# Patient Record
Sex: Female | Born: 1935 | Race: White | Hispanic: No
Health system: Southern US, Community
[De-identification: ages and names within clinical notes are randomized; demographics above are authoritative.]

---

## 2004-04-15 ENCOUNTER — Inpatient Hospital Stay (HOSPITAL_COMMUNITY): Admission: RE | Admit: 2004-04-15 | Discharge: 2004-04-16 | Payer: Self-pay | Admitting: Neurological Surgery

## 2004-04-18 ENCOUNTER — Emergency Department: Payer: Self-pay | Admitting: Emergency Medicine

## 2005-01-19 ENCOUNTER — Ambulatory Visit: Payer: Self-pay | Admitting: Family Medicine

## 2005-06-14 ENCOUNTER — Ambulatory Visit: Payer: Self-pay | Admitting: Rheumatology

## 2006-01-25 ENCOUNTER — Ambulatory Visit: Payer: Self-pay | Admitting: Family Medicine

## 2006-02-01 ENCOUNTER — Other Ambulatory Visit: Payer: Self-pay

## 2006-02-01 ENCOUNTER — Emergency Department: Payer: Self-pay

## 2006-03-28 ENCOUNTER — Ambulatory Visit: Payer: Self-pay | Admitting: General Surgery

## 2006-08-04 ENCOUNTER — Emergency Department: Payer: Self-pay | Admitting: Emergency Medicine

## 2006-08-04 ENCOUNTER — Other Ambulatory Visit: Payer: Self-pay

## 2006-12-13 ENCOUNTER — Ambulatory Visit: Payer: Self-pay | Admitting: Family Medicine

## 2007-01-30 ENCOUNTER — Ambulatory Visit: Payer: Self-pay | Admitting: Family Medicine

## 2007-01-31 ENCOUNTER — Other Ambulatory Visit: Payer: Self-pay

## 2007-01-31 ENCOUNTER — Inpatient Hospital Stay: Payer: Self-pay | Admitting: Internal Medicine

## 2007-02-01 ENCOUNTER — Other Ambulatory Visit: Payer: Self-pay

## 2008-01-30 ENCOUNTER — Encounter: Payer: Self-pay | Admitting: Family Medicine

## 2008-01-31 ENCOUNTER — Ambulatory Visit: Payer: Self-pay | Admitting: Family Medicine

## 2008-02-12 ENCOUNTER — Encounter: Payer: Self-pay | Admitting: Family Medicine

## 2008-03-13 ENCOUNTER — Encounter: Payer: Self-pay | Admitting: Family Medicine

## 2008-04-13 ENCOUNTER — Encounter: Payer: Self-pay | Admitting: Family Medicine

## 2008-04-18 ENCOUNTER — Emergency Department: Payer: Self-pay | Admitting: Emergency Medicine

## 2008-11-08 ENCOUNTER — Ambulatory Visit: Payer: Self-pay | Admitting: Neurology

## 2009-02-03 ENCOUNTER — Ambulatory Visit: Payer: Self-pay | Admitting: Family Medicine

## 2009-06-25 ENCOUNTER — Ambulatory Visit: Payer: Self-pay | Admitting: Family Medicine

## 2009-07-20 ENCOUNTER — Ambulatory Visit: Payer: Self-pay | Admitting: Family Medicine

## 2009-07-29 ENCOUNTER — Ambulatory Visit: Payer: Self-pay | Admitting: Family Medicine

## 2009-08-08 ENCOUNTER — Inpatient Hospital Stay: Payer: Self-pay | Admitting: Internal Medicine

## 2009-09-14 ENCOUNTER — Encounter: Admission: RE | Admit: 2009-09-14 | Discharge: 2009-09-14 | Payer: Self-pay | Admitting: Neurological Surgery

## 2009-10-06 ENCOUNTER — Encounter: Admission: RE | Admit: 2009-10-06 | Discharge: 2009-10-06 | Payer: Self-pay | Admitting: Neurosurgery

## 2009-11-03 ENCOUNTER — Encounter: Admission: RE | Admit: 2009-11-03 | Discharge: 2009-11-03 | Payer: Self-pay | Admitting: Neurological Surgery

## 2009-12-18 ENCOUNTER — Ambulatory Visit: Payer: Self-pay | Admitting: Neurological Surgery

## 2009-12-24 ENCOUNTER — Ambulatory Visit: Payer: Self-pay | Admitting: Neurological Surgery

## 2010-03-10 ENCOUNTER — Ambulatory Visit: Payer: Self-pay | Admitting: Family Medicine

## 2010-10-29 NOTE — Op Note (Signed)
NAMEANZLEY, DIBBERN NO.:  [id number]   MEDICAL RECORD NO.:  [id number]          PATIENT TYPE:  INP   LOCATION:  2899                         FACILITY:  [facility]   PHYSICIAN:  Tia Alert, MD     DATE OF BIRTH:  [birth year]   DATE OF PROCEDURE:  04/15/2004  DATE OF DISCHARGE:                                 OPERATIVE REPORT   REFERRING PHYSICIAN:  Valinda Hoar, M.D.   PREOPERATIVE DIAGNOSIS:  Lumbar spinal stenosis L2-3, L3-4.   POSTOPERATIVE DIAGNOSIS:  Lumbar spinal stenosis L2-3, L3-4.   PROCEDURE:  Decompressive lumbar hemilaminectomy, medial facetectomy and  foraminotomies L2-3 and L3-4 with sublaminar decompression for central canal  and right lateral recess decompression.   SURGEON:  Tia Alert, M.D.   ANESTHESIA:  General endotracheal anesthesia.   COMPLICATIONS:  None apparent.   INDICATIONS FOR PROCEDURE:  Ms. [name] Stanton is a 77 year old white female who  presented to the neurosurgery clinic with complaints of left greater than  right leg pain especially with ambulation.  She was walking in a flexed  posture.  She had an MRI which showed a severe spinal stenosis at L2-3 and  L3-4.  Recommended a lumbar decompression.  She understood the risks,  benefits, and alternatives and wished to proceed.   DESCRIPTION OF PROCEDURE:  The patient was taken to the operating room and  after induction of adequate general endotracheal anesthesia, her lumbar  region was prepped with DuraPrep and then draped in the usual sterile  fashion.  There were 10 mL of local anesthesia injected and then a dorsal  midline incision was made and carried down to the lumbosacral fascia.  The  fascia was opened and the paraspinous musculature was taken down in the  subperiosteal fashion to expose L2-3 and L3-4 on the left side.  Intraoperative x-ray confirmed my level and then using combination of the  high speed air powered __________ drill and the Kerrison punches, a  hemilaminectomy, medial facetectomy and foraminotomy was performed at L2-3  and L3-4 on the left side.  The ligament was opened and removed to expose  the underlying dura.  I was able to use the drill to drill up under the  spinous process and then perform sublaminar decompressions at each level for  central canal and right lateral recess decompression.  I was able to palpate  the pedicles on each side at both levels.  Once the decompression was  complete, I irrigated with copious amounts of Bacitracin containing saline  solution, dried all bleeding ports with bipolar cautery and inspected the  nerve roots once again to assure adequate decompression and then layered the  dura with Gelfoam and closed the fascia with interrupted #1 Vicryl, closed  subcutaneous and subcuticular tissue with 2-0 and 3-0 Vicryl and closed the  skin with Dermabond.  The drapes were removed.  The patient was awakened  from general anesthesia and transferred to the recovery room in stable  condition.  Sponge, needle and instrument counts were correct at the end of  the procedure.       DSJ/MEDQ  D:  04/15/2004  T:  04/15/2004  Job:  [id number]   cc:   Deeann Saint, M.D.

## 2010-12-29 ENCOUNTER — Ambulatory Visit: Payer: Self-pay | Admitting: Pain Medicine

## 2010-12-30 ENCOUNTER — Other Ambulatory Visit: Payer: Self-pay | Admitting: Pain Medicine

## 2011-01-06 ENCOUNTER — Encounter: Payer: Self-pay | Admitting: Pain Medicine

## 2011-01-12 ENCOUNTER — Encounter: Payer: Self-pay | Admitting: Pain Medicine

## 2011-01-26 ENCOUNTER — Ambulatory Visit: Payer: Self-pay | Admitting: Family Medicine

## 2011-03-06 ENCOUNTER — Ambulatory Visit: Payer: Self-pay | Admitting: Surgery

## 2011-03-09 ENCOUNTER — Ambulatory Visit: Payer: Self-pay | Admitting: Pain Medicine

## 2011-03-17 ENCOUNTER — Ambulatory Visit: Payer: Self-pay | Admitting: Unknown Physician Specialty

## 2011-04-06 ENCOUNTER — Ambulatory Visit: Payer: Self-pay | Admitting: Unknown Physician Specialty

## 2012-01-12 ENCOUNTER — Ambulatory Visit: Payer: Self-pay | Admitting: Family Medicine

## 2012-08-22 ENCOUNTER — Ambulatory Visit: Payer: Self-pay | Admitting: Family Medicine

## 2013-01-21 ENCOUNTER — Ambulatory Visit: Payer: Self-pay | Admitting: Family Medicine

## 2013-04-19 ENCOUNTER — Emergency Department: Payer: Self-pay | Admitting: Internal Medicine

## 2013-04-19 LAB — URINALYSIS, COMPLETE
Bilirubin,UR: NEGATIVE
Glucose,UR: NEGATIVE mg/dL (ref 0–75)
Hyaline Cast: 2
Ketone: NEGATIVE
Nitrite: POSITIVE
Ph: 6 (ref 4.5–8.0)
Protein: 30
RBC,UR: 51 /HPF (ref 0–5)
Specific Gravity: 1.01 (ref 1.003–1.030)
Squamous Epithelial: 1
WBC UR: 177 /HPF (ref 0–5)

## 2013-04-19 LAB — COMPREHENSIVE METABOLIC PANEL
Albumin: 3.4 g/dL (ref 3.4–5.0)
Alkaline Phosphatase: 100 U/L (ref 50–136)
Anion Gap: 5 — ABNORMAL LOW (ref 7–16)
BUN: 12 mg/dL (ref 7–18)
Bilirubin,Total: 1.6 mg/dL — ABNORMAL HIGH (ref 0.2–1.0)
Calcium, Total: 8.8 mg/dL (ref 8.5–10.1)
Chloride: 107 mmol/L (ref 98–107)
Co2: 27 mmol/L (ref 21–32)
Creatinine: 0.76 mg/dL (ref 0.60–1.30)
EGFR (African American): >60
EGFR (Non-African Amer.): >60
Glucose: 99 mg/dL (ref 65–99)
Osmolality: 277 (ref 275–301)
Potassium: 4 mmol/L (ref 3.5–5.1)
SGOT(AST): 30 U/L (ref 15–37)
SGPT (ALT): 22 U/L (ref 12–78)
Sodium: 139 mmol/L (ref 136–145)
Total Protein: 7.1 g/dL (ref 6.4–8.2)

## 2013-04-19 LAB — CBC WITH DIFFERENTIAL/PLATELET
Basophil #: 0.1 10*3/uL (ref 0.0–0.1)
Basophil %: 1 %
Eosinophil #: 0.4 10*3/uL (ref 0.0–0.7)
Eosinophil %: 5.6 %
HCT: 36.5 % (ref 35.0–47.0)
HGB: 12.8 g/dL (ref 12.0–16.0)
Lymphocyte #: 1.3 10*3/uL (ref 1.0–3.6)
Lymphocyte %: 19.2 %
MCH: 32.8 pg (ref 26.0–34.0)
MCHC: 35.2 g/dL (ref 32.0–36.0)
MCV: 93 fL (ref 80–100)
Monocyte #: 0.5 x10 3/mm (ref 0.2–0.9)
Monocyte %: 7.4 %
Neutrophil #: 4.4 10*3/uL (ref 1.4–6.5)
Neutrophil %: 66.8 %
Platelet: 117 10*3/uL — ABNORMAL LOW (ref 150–440)
RBC: 3.92 10*6/uL (ref 3.80–5.20)
RDW: 14.3 % (ref 11.5–14.5)
WBC: 6.6 10*3/uL (ref 3.6–11.0)

## 2013-04-19 LAB — PROTIME-INR
INR: 1.8
Prothrombin Time: 20.3 secs — ABNORMAL HIGH (ref 11.5–14.7)

## 2013-05-31 ENCOUNTER — Emergency Department: Payer: Self-pay | Admitting: Emergency Medicine

## 2013-05-31 LAB — PROTIME-INR
INR: 6
Prothrombin Time: 50.7 secs — ABNORMAL HIGH (ref 11.5–14.7)

## 2013-06-12 ENCOUNTER — Emergency Department: Payer: Self-pay | Admitting: Emergency Medicine

## 2013-06-12 LAB — COMPREHENSIVE METABOLIC PANEL
Albumin: 3 g/dL — ABNORMAL LOW (ref 3.4–5.0)
Alkaline Phosphatase: 93 U/L
Anion Gap: 3 — ABNORMAL LOW (ref 7–16)
BUN: 14 mg/dL (ref 7–18)
Bilirubin,Total: 1.2 mg/dL — ABNORMAL HIGH (ref 0.2–1.0)
Calcium, Total: 8.7 mg/dL (ref 8.5–10.1)
Chloride: 101 mmol/L (ref 98–107)
Co2: 31 mmol/L (ref 21–32)
Creatinine: 0.86 mg/dL (ref 0.60–1.30)
EGFR (African American): >60
EGFR (Non-African Amer.): >60
Glucose: 110 mg/dL — ABNORMAL HIGH (ref 65–99)
Osmolality: 271 (ref 275–301)
Potassium: 3.9 mmol/L (ref 3.5–5.1)
SGOT(AST): 36 U/L (ref 15–37)
SGPT (ALT): 9 U/L — ABNORMAL LOW (ref 12–78)
Sodium: 135 mmol/L — ABNORMAL LOW (ref 136–145)
Total Protein: 6.9 g/dL (ref 6.4–8.2)

## 2013-06-12 LAB — URINALYSIS, COMPLETE
Bilirubin,UR: NEGATIVE
Blood: NEGATIVE
Glucose,UR: NEGATIVE mg/dL (ref 0–75)
Ketone: NEGATIVE
Leukocyte Esterase: NEGATIVE
Nitrite: NEGATIVE
Ph: 7 (ref 4.5–8.0)
Protein: NEGATIVE
RBC,UR: 1 /HPF (ref 0–5)
Specific Gravity: 1.008 (ref 1.003–1.030)
Squamous Epithelial: <1
WBC UR: <1 /HPF (ref 0–5)

## 2013-06-12 LAB — TROPONIN I
Troponin-I: <0.02 ng/mL
Troponin-I: <0.02 ng/mL

## 2013-06-12 LAB — PROTIME-INR
INR: 1.6
Prothrombin Time: 18.8 secs — ABNORMAL HIGH (ref 11.5–14.7)

## 2013-06-12 LAB — CBC
HCT: 35.7 % (ref 35.0–47.0)
HGB: 12.3 g/dL (ref 12.0–16.0)
MCH: 32.9 pg (ref 26.0–34.0)
MCHC: 34.4 g/dL (ref 32.0–36.0)
MCV: 96 fL (ref 80–100)
Platelet: 124 10*3/uL — ABNORMAL LOW (ref 150–440)
RBC: 3.74 10*6/uL — ABNORMAL LOW (ref 3.80–5.20)
RDW: 15 % — ABNORMAL HIGH (ref 11.5–14.5)
WBC: 4.8 10*3/uL (ref 3.6–11.0)

## 2013-06-25 ENCOUNTER — Emergency Department: Payer: Self-pay | Admitting: Emergency Medicine

## 2013-06-25 LAB — CBC WITH DIFFERENTIAL/PLATELET
Basophil #: 0.1 10*3/uL (ref 0.0–0.1)
Basophil %: 1.1 %
Eosinophil #: 0.3 10*3/uL (ref 0.0–0.7)
Eosinophil %: 4.1 %
HCT: 38.7 % (ref 35.0–47.0)
HGB: 13.2 g/dL (ref 12.0–16.0)
Lymphocyte #: 1.4 10*3/uL (ref 1.0–3.6)
Lymphocyte %: 23 %
MCH: 32.6 pg (ref 26.0–34.0)
MCHC: 34.2 g/dL (ref 32.0–36.0)
MCV: 95 fL (ref 80–100)
Monocyte #: 0.6 x10 3/mm (ref 0.2–0.9)
Monocyte %: 9.7 %
Neutrophil #: 3.8 10*3/uL (ref 1.4–6.5)
Neutrophil %: 62.1 %
Platelet: 161 10*3/uL (ref 150–440)
RBC: 4.06 10*6/uL (ref 3.80–5.20)
RDW: 15 % — ABNORMAL HIGH (ref 11.5–14.5)
WBC: 6.1 10*3/uL (ref 3.6–11.0)

## 2013-06-25 LAB — COMPREHENSIVE METABOLIC PANEL
Albumin: 3.4 g/dL (ref 3.4–5.0)
Alkaline Phosphatase: 114 U/L
Anion Gap: 4 — ABNORMAL LOW (ref 7–16)
BUN: 26 mg/dL — ABNORMAL HIGH (ref 7–18)
Bilirubin,Total: 0.8 mg/dL (ref 0.2–1.0)
Calcium, Total: 9.2 mg/dL (ref 8.5–10.1)
Chloride: 102 mmol/L (ref 98–107)
Co2: 32 mmol/L (ref 21–32)
Creatinine: 1 mg/dL (ref 0.60–1.30)
EGFR (African American): 59 — ABNORMAL LOW
EGFR (Non-African Amer.): 51 — ABNORMAL LOW
Glucose: 108 mg/dL — ABNORMAL HIGH (ref 65–99)
Osmolality: 281 (ref 275–301)
Potassium: 3.6 mmol/L (ref 3.5–5.1)
SGOT(AST): 22 U/L (ref 15–37)
SGPT (ALT): 7 U/L — ABNORMAL LOW (ref 12–78)
Sodium: 138 mmol/L (ref 136–145)
Total Protein: 7.6 g/dL (ref 6.4–8.2)

## 2013-06-25 LAB — PROTIME-INR
INR: 1.4
Prothrombin Time: 16.8 secs — ABNORMAL HIGH (ref 11.5–14.7)

## 2013-07-15 ENCOUNTER — Emergency Department: Payer: Self-pay | Admitting: Emergency Medicine

## 2013-07-15 LAB — PROTIME-INR
INR: 1.7
Prothrombin Time: 19.3 secs — ABNORMAL HIGH (ref 11.5–14.7)

## 2013-07-15 LAB — BASIC METABOLIC PANEL
Anion Gap: 7 (ref 7–16)
BUN: 25 mg/dL — ABNORMAL HIGH (ref 7–18)
Calcium, Total: 8.6 mg/dL (ref 8.5–10.1)
Chloride: 106 mmol/L (ref 98–107)
Co2: 27 mmol/L (ref 21–32)
Creatinine: 0.8 mg/dL (ref 0.60–1.30)
EGFR (African American): >60
EGFR (Non-African Amer.): >60
Glucose: 103 mg/dL — ABNORMAL HIGH (ref 65–99)
Osmolality: 284 (ref 275–301)
Potassium: 3.6 mmol/L (ref 3.5–5.1)
Sodium: 140 mmol/L (ref 136–145)

## 2013-07-15 LAB — CBC
HCT: 40.3 % (ref 35.0–47.0)
HGB: 13.4 g/dL (ref 12.0–16.0)
MCH: 32.4 pg (ref 26.0–34.0)
MCHC: 33.1 g/dL (ref 32.0–36.0)
MCV: 98 fL (ref 80–100)
Platelet: 120 10*3/uL — ABNORMAL LOW (ref 150–440)
RBC: 4.12 10*6/uL (ref 3.80–5.20)
RDW: 14.7 % — ABNORMAL HIGH (ref 11.5–14.5)
WBC: 6.3 10*3/uL (ref 3.6–11.0)

## 2013-08-20 ENCOUNTER — Ambulatory Visit: Payer: Self-pay | Admitting: Podiatry

## 2013-08-27 ENCOUNTER — Inpatient Hospital Stay: Payer: Self-pay | Admitting: Internal Medicine

## 2013-08-27 LAB — CBC WITH DIFFERENTIAL/PLATELET
Basophil #: 0.1 10*3/uL (ref 0.0–0.1)
Basophil %: 1.1 %
Eosinophil #: 0.2 10*3/uL (ref 0.0–0.7)
Eosinophil %: 3.5 %
HCT: 37.9 % (ref 35.0–47.0)
HGB: 12.7 g/dL (ref 12.0–16.0)
Lymphocyte #: 0.9 10*3/uL — ABNORMAL LOW (ref 1.0–3.6)
Lymphocyte %: 12.3 %
MCH: 32.9 pg (ref 26.0–34.0)
MCHC: 33.6 g/dL (ref 32.0–36.0)
MCV: 98 fL (ref 80–100)
Monocyte #: 0.8 x10 3/mm (ref 0.2–0.9)
Monocyte %: 12.1 %
Neutrophil #: 5 10*3/uL (ref 1.4–6.5)
Neutrophil %: 71 %
Platelet: 99 10*3/uL — ABNORMAL LOW (ref 150–440)
RBC: 3.87 10*6/uL (ref 3.80–5.20)
RDW: 14.4 % (ref 11.5–14.5)
WBC: 7 10*3/uL (ref 3.6–11.0)

## 2013-08-27 LAB — BASIC METABOLIC PANEL
Anion Gap: 8 (ref 7–16)
BUN: 13 mg/dL (ref 7–18)
Calcium, Total: 8.6 mg/dL (ref 8.5–10.1)
Chloride: 104 mmol/L (ref 98–107)
Co2: 25 mmol/L (ref 21–32)
Creatinine: 0.72 mg/dL (ref 0.60–1.30)
EGFR (African American): >60
EGFR (Non-African Amer.): >60
Glucose: 100 mg/dL — ABNORMAL HIGH (ref 65–99)
Osmolality: 274 (ref 275–301)
Potassium: 3.5 mmol/L (ref 3.5–5.1)
Sodium: 137 mmol/L (ref 136–145)

## 2013-08-27 LAB — PROTIME-INR
INR: 1.5
Prothrombin Time: 17.8 secs — ABNORMAL HIGH (ref 11.5–14.7)

## 2013-08-28 LAB — PROTIME-INR
INR: 1.6
Prothrombin Time: 18.3 secs — ABNORMAL HIGH (ref 11.5–14.7)

## 2013-08-28 LAB — COMPREHENSIVE METABOLIC PANEL
Albumin: 3 g/dL — ABNORMAL LOW (ref 3.4–5.0)
Alkaline Phosphatase: 85 U/L
Anion Gap: 4 — ABNORMAL LOW (ref 7–16)
BUN: 15 mg/dL (ref 7–18)
Bilirubin,Total: 1.6 mg/dL — ABNORMAL HIGH (ref 0.2–1.0)
Calcium, Total: 8.5 mg/dL (ref 8.5–10.1)
Chloride: 103 mmol/L (ref 98–107)
Co2: 31 mmol/L (ref 21–32)
Creatinine: 0.9 mg/dL (ref 0.60–1.30)
EGFR (African American): >60
EGFR (Non-African Amer.): 58 — ABNORMAL LOW
Glucose: 83 mg/dL (ref 65–99)
Osmolality: 276 (ref 275–301)
Potassium: 3.3 mmol/L — ABNORMAL LOW (ref 3.5–5.1)
SGOT(AST): 19 U/L (ref 15–37)
SGPT (ALT): 19 U/L (ref 12–78)
Sodium: 138 mmol/L (ref 136–145)
Total Protein: 6.8 g/dL (ref 6.4–8.2)

## 2013-08-28 LAB — CBC WITH DIFFERENTIAL/PLATELET
Basophil #: 0.1 10*3/uL (ref 0.0–0.1)
Basophil %: 0.9 %
Eosinophil #: 0.2 10*3/uL (ref 0.0–0.7)
Eosinophil %: 3.8 %
HCT: 40.2 % (ref 35.0–47.0)
HGB: 13.3 g/dL (ref 12.0–16.0)
Lymphocyte #: 0.9 10*3/uL — ABNORMAL LOW (ref 1.0–3.6)
Lymphocyte %: 14.3 %
MCH: 32.7 pg (ref 26.0–34.0)
MCHC: 33 g/dL (ref 32.0–36.0)
MCV: 99 fL (ref 80–100)
Monocyte #: 0.8 x10 3/mm (ref 0.2–0.9)
Monocyte %: 12.6 %
Neutrophil #: 4.2 10*3/uL (ref 1.4–6.5)
Neutrophil %: 68.4 %
Platelet: 100 10*3/uL — ABNORMAL LOW (ref 150–440)
RBC: 4.06 10*6/uL (ref 3.80–5.20)
RDW: 14.6 % — ABNORMAL HIGH (ref 11.5–14.5)
WBC: 6.1 10*3/uL (ref 3.6–11.0)

## 2013-08-29 LAB — COMPREHENSIVE METABOLIC PANEL
Albumin: 2.7 g/dL — ABNORMAL LOW (ref 3.4–5.0)
Alkaline Phosphatase: 79 U/L
Anion Gap: 3 — ABNORMAL LOW (ref 7–16)
BUN: 16 mg/dL (ref 7–18)
Bilirubin,Total: 1.1 mg/dL — ABNORMAL HIGH (ref 0.2–1.0)
Calcium, Total: 8 mg/dL — ABNORMAL LOW (ref 8.5–10.1)
Chloride: 101 mmol/L (ref 98–107)
Co2: 33 mmol/L — ABNORMAL HIGH (ref 21–32)
Creatinine: 0.91 mg/dL (ref 0.60–1.30)
EGFR (African American): >60
EGFR (Non-African Amer.): 57 — ABNORMAL LOW
Glucose: 102 mg/dL — ABNORMAL HIGH (ref 65–99)
Osmolality: 275 (ref 275–301)
Potassium: 3.5 mmol/L (ref 3.5–5.1)
SGOT(AST): 27 U/L (ref 15–37)
SGPT (ALT): 18 U/L (ref 12–78)
Sodium: 137 mmol/L (ref 136–145)
Total Protein: 6.4 g/dL (ref 6.4–8.2)

## 2013-08-29 LAB — PROTIME-INR
INR: 1.8
Prothrombin Time: 20.8 secs — ABNORMAL HIGH (ref 11.5–14.7)

## 2013-08-30 ENCOUNTER — Encounter: Payer: Self-pay | Admitting: Internal Medicine

## 2013-08-30 LAB — PROTIME-INR
INR: 1.9
Prothrombin Time: 21.7 secs — ABNORMAL HIGH (ref 11.5–14.7)

## 2013-08-30 LAB — COMPREHENSIVE METABOLIC PANEL
ALT: 16 U/L (ref 12–78)
ANION GAP: 2 — AB (ref 7–16)
Albumin: 2.9 g/dL — ABNORMAL LOW (ref 3.4–5.0)
Alkaline Phosphatase: 97 U/L
BUN: 16 mg/dL (ref 7–18)
Bilirubin,Total: 1 mg/dL (ref 0.2–1.0)
CO2: 33 mmol/L — AB (ref 21–32)
Calcium, Total: 8.6 mg/dL (ref 8.5–10.1)
Chloride: 104 mmol/L (ref 98–107)
Creatinine: 0.81 mg/dL (ref 0.60–1.30)
EGFR (African American): >60
EGFR (Non-African Amer.): >60
Glucose: 90 mg/dL (ref 65–99)
Osmolality: 278 (ref 275–301)
Potassium: 3.3 mmol/L — ABNORMAL LOW (ref 3.5–5.1)
SGOT(AST): 23 U/L (ref 15–37)
Sodium: 139 mmol/L (ref 136–145)
Total Protein: 6.9 g/dL (ref 6.4–8.2)

## 2013-09-05 LAB — PROTIME-INR
INR: 1.7
Prothrombin Time: 19.8 secs — ABNORMAL HIGH (ref 11.5–14.7)

## 2013-09-06 ENCOUNTER — Ambulatory Visit: Payer: Self-pay | Admitting: Podiatry

## 2013-09-10 LAB — URINALYSIS, COMPLETE
Bilirubin,UR: NEGATIVE
Blood: NEGATIVE
Glucose,UR: NEGATIVE mg/dL (ref 0–75)
Hyaline Cast: 7
Ketone: NEGATIVE
Nitrite: POSITIVE
Ph: 6 (ref 4.5–8.0)
Protein: NEGATIVE
RBC,UR: NONE SEEN /HPF (ref 0–5)
Specific Gravity: 1.01 (ref 1.003–1.030)
Squamous Epithelial: <1
WBC UR: 10 /HPF (ref 0–5)

## 2013-09-11 ENCOUNTER — Encounter: Payer: Self-pay | Admitting: Internal Medicine

## 2013-09-12 LAB — PROTIME-INR
INR: 1.8
Prothrombin Time: 20.5 secs — ABNORMAL HIGH (ref 11.5–14.7)

## 2013-09-12 LAB — URINE CULTURE

## 2013-09-17 LAB — PROTIME-INR
INR: 1.8
Prothrombin Time: 20.4 secs — ABNORMAL HIGH (ref 11.5–14.7)

## 2013-09-24 LAB — BASIC METABOLIC PANEL
Anion Gap: 5 — ABNORMAL LOW (ref 7–16)
BUN: 14 mg/dL (ref 7–18)
Calcium, Total: 8.9 mg/dL (ref 8.5–10.1)
Chloride: 103 mmol/L (ref 98–107)
Co2: 32 mmol/L (ref 21–32)
Creatinine: 0.64 mg/dL (ref 0.60–1.30)
EGFR (African American): >60
EGFR (Non-African Amer.): >60
Glucose: 88 mg/dL (ref 65–99)
Osmolality: 279 (ref 275–301)
Potassium: 3.9 mmol/L (ref 3.5–5.1)
Sodium: 140 mmol/L (ref 136–145)

## 2013-09-24 LAB — PROTIME-INR
INR: 2.6
Prothrombin Time: 27.4 secs — ABNORMAL HIGH (ref 11.5–14.7)

## 2013-10-11 ENCOUNTER — Ambulatory Visit: Payer: Self-pay | Admitting: Internal Medicine

## 2013-10-26 ENCOUNTER — Inpatient Hospital Stay: Payer: Self-pay | Admitting: Internal Medicine

## 2013-10-26 LAB — CBC
HCT: 40 % (ref 35.0–47.0)
HGB: 13.5 g/dL (ref 12.0–16.0)
MCH: 33.1 pg (ref 26.0–34.0)
MCHC: 33.8 g/dL (ref 32.0–36.0)
MCV: 98 fL (ref 80–100)
Platelet: 152 10*3/uL (ref 150–440)
RBC: 4.09 10*6/uL (ref 3.80–5.20)
RDW: 14.8 % — ABNORMAL HIGH (ref 11.5–14.5)
WBC: 8.6 10*3/uL (ref 3.6–11.0)

## 2013-10-26 LAB — TROPONIN I
Troponin-I: 0.07 ng/mL — ABNORMAL HIGH
Troponin-I: 0.06 ng/mL — ABNORMAL HIGH
Troponin-I: 0.07 ng/mL — ABNORMAL HIGH

## 2013-10-26 LAB — URINALYSIS, COMPLETE
Bacteria: NONE SEEN
Bilirubin,UR: NEGATIVE
Glucose,UR: NEGATIVE mg/dL (ref 0–75)
Hyaline Cast: 2
Nitrite: NEGATIVE
Ph: 5 (ref 4.5–8.0)
Protein: NEGATIVE
RBC,UR: 26 /HPF (ref 0–5)
Specific Gravity: 1.013 (ref 1.003–1.030)
Squamous Epithelial: 1
WBC UR: 3 /HPF (ref 0–5)

## 2013-10-26 LAB — BASIC METABOLIC PANEL
Anion Gap: 7 (ref 7–16)
BUN: 26 mg/dL — ABNORMAL HIGH (ref 7–18)
Calcium, Total: 9.2 mg/dL (ref 8.5–10.1)
Chloride: 102 mmol/L (ref 98–107)
Co2: 32 mmol/L (ref 21–32)
Creatinine: 0.9 mg/dL (ref 0.60–1.30)
EGFR (African American): >60
EGFR (Non-African Amer.): 58 — ABNORMAL LOW
Glucose: 139 mg/dL — ABNORMAL HIGH (ref 65–99)
Osmolality: 288 (ref 275–301)
Potassium: 3.5 mmol/L (ref 3.5–5.1)
Sodium: 141 mmol/L (ref 136–145)

## 2013-10-26 LAB — CK-MB
CK-MB: 3.8 ng/mL — ABNORMAL HIGH (ref 0.5–3.6)
CK-MB: 3.5 ng/mL (ref 0.5–3.6)
CK-MB: 3.7 ng/mL — AB (ref 0.5–3.6)

## 2013-10-26 LAB — APTT: Activated PTT: 67.8 secs — ABNORMAL HIGH (ref 23.6–35.9)

## 2013-10-26 LAB — PROTIME-INR
INR: 2.7
Prothrombin Time: 28 secs — ABNORMAL HIGH (ref 11.5–14.7)

## 2013-10-26 LAB — CK: CK, Total: 245 U/L — ABNORMAL HIGH

## 2013-10-27 LAB — BASIC METABOLIC PANEL
Anion Gap: 3 — ABNORMAL LOW (ref 7–16)
BUN: 25 mg/dL — AB (ref 7–18)
CALCIUM: 8.4 mg/dL — AB (ref 8.5–10.1)
CHLORIDE: 106 mmol/L (ref 98–107)
CO2: 31 mmol/L (ref 21–32)
CREATININE: 0.87 mg/dL (ref 0.60–1.30)
EGFR (Non-African Amer.): >60
Glucose: 98 mg/dL (ref 65–99)
Osmolality: 284 (ref 275–301)
Potassium: 3.4 mmol/L — ABNORMAL LOW (ref 3.5–5.1)
Sodium: 140 mmol/L (ref 136–145)

## 2013-10-27 LAB — CBC WITH DIFFERENTIAL/PLATELET
BASOS ABS: 0 10*3/uL (ref 0.0–0.1)
Basophil %: 0.4 %
Eosinophil #: 0 10*3/uL (ref 0.0–0.7)
Eosinophil %: 0.1 %
HCT: 37.8 % (ref 35.0–47.0)
HGB: 12.4 g/dL (ref 12.0–16.0)
Lymphocyte #: 0.8 10*3/uL — ABNORMAL LOW (ref 1.0–3.6)
Lymphocyte %: 11.3 %
MCH: 32.2 pg (ref 26.0–34.0)
MCHC: 32.7 g/dL (ref 32.0–36.0)
MCV: 98 fL (ref 80–100)
Monocyte #: 0.7 x10 3/mm (ref 0.2–0.9)
Monocyte %: 9.7 %
NEUTROS ABS: 5.4 10*3/uL (ref 1.4–6.5)
NEUTROS PCT: 78.5 %
Platelet: 131 10*3/uL — ABNORMAL LOW (ref 150–440)
RBC: 3.84 10*6/uL (ref 3.80–5.20)
RDW: 15.3 % — ABNORMAL HIGH (ref 11.5–14.5)
WBC: 6.8 10*3/uL (ref 3.6–11.0)

## 2013-10-27 LAB — PROTIME-INR
INR: 2.8
Prothrombin Time: 29 secs — ABNORMAL HIGH (ref 11.5–14.7)

## 2013-10-28 LAB — CBC WITH DIFFERENTIAL/PLATELET
Basophil #: 0 10*3/uL (ref 0.0–0.1)
Basophil %: 0.7 %
EOS ABS: 0.1 10*3/uL (ref 0.0–0.7)
Eosinophil %: 1.2 %
HCT: 35.3 % (ref 35.0–47.0)
HGB: 11.7 g/dL — ABNORMAL LOW (ref 12.0–16.0)
LYMPHS PCT: 14.6 %
Lymphocyte #: 0.8 10*3/uL — ABNORMAL LOW (ref 1.0–3.6)
MCH: 32.6 pg (ref 26.0–34.0)
MCHC: 33.1 g/dL (ref 32.0–36.0)
MCV: 98 fL (ref 80–100)
MONO ABS: 0.5 x10 3/mm (ref 0.2–0.9)
Monocyte %: 9.6 %
NEUTROS ABS: 4.1 10*3/uL (ref 1.4–6.5)
Neutrophil %: 73.9 %
Platelet: 119 10*3/uL — ABNORMAL LOW (ref 150–440)
RBC: 3.59 10*6/uL — ABNORMAL LOW (ref 3.80–5.20)
RDW: 15.1 % — AB (ref 11.5–14.5)
WBC: 5.6 10*3/uL (ref 3.6–11.0)

## 2013-10-28 LAB — BASIC METABOLIC PANEL
ANION GAP: 7 (ref 7–16)
BUN: 23 mg/dL — AB (ref 7–18)
CALCIUM: 8.1 mg/dL — AB (ref 8.5–10.1)
CREATININE: 0.94 mg/dL (ref 0.60–1.30)
Chloride: 105 mmol/L (ref 98–107)
Co2: 28 mmol/L (ref 21–32)
GFR CALC NON AF AMER: 55 — AB
Glucose: 79 mg/dL (ref 65–99)
OSMOLALITY: 282 (ref 275–301)
Potassium: 3.9 mmol/L (ref 3.5–5.1)
Sodium: 140 mmol/L (ref 136–145)

## 2013-10-28 LAB — PROTIME-INR
INR: 3
Prothrombin Time: 30.1 secs — ABNORMAL HIGH (ref 11.5–14.7)

## 2013-10-29 LAB — CBC WITH DIFFERENTIAL/PLATELET
Basophil #: 0 10*3/uL (ref 0.0–0.1)
Basophil %: 0.5 %
EOS ABS: 0.1 10*3/uL (ref 0.0–0.7)
EOS PCT: 2.6 %
HCT: 39.3 % (ref 35.0–47.0)
HGB: 13.4 g/dL (ref 12.0–16.0)
Lymphocyte #: 0.9 10*3/uL — ABNORMAL LOW (ref 1.0–3.6)
Lymphocyte %: 17 %
MCH: 33.2 pg (ref 26.0–34.0)
MCHC: 34 g/dL (ref 32.0–36.0)
MCV: 98 fL (ref 80–100)
MONOS PCT: 10 %
Monocyte #: 0.5 x10 3/mm (ref 0.2–0.9)
NEUTROS ABS: 3.5 10*3/uL (ref 1.4–6.5)
NEUTROS PCT: 69.9 %
PLATELETS: 132 10*3/uL — AB (ref 150–440)
RBC: 4.02 10*6/uL (ref 3.80–5.20)
RDW: 14.8 % — AB (ref 11.5–14.5)
WBC: 5 10*3/uL (ref 3.6–11.0)

## 2013-10-29 LAB — BASIC METABOLIC PANEL
ANION GAP: 4 — AB (ref 7–16)
BUN: 19 mg/dL — ABNORMAL HIGH (ref 7–18)
Calcium, Total: 8.4 mg/dL — ABNORMAL LOW (ref 8.5–10.1)
Chloride: 103 mmol/L (ref 98–107)
Co2: 26 mmol/L (ref 21–32)
Creatinine: 0.66 mg/dL (ref 0.60–1.30)
EGFR (African American): >60
EGFR (Non-African Amer.): >60
Glucose: 93 mg/dL (ref 65–99)
Osmolality: 268 (ref 275–301)
POTASSIUM: 4.5 mmol/L (ref 3.5–5.1)
Sodium: 133 mmol/L — ABNORMAL LOW (ref 136–145)

## 2013-10-29 LAB — PROTIME-INR
INR: 2.9
PROTHROMBIN TIME: 29.3 s — AB (ref 11.5–14.7)

## 2013-10-30 LAB — PROTIME-INR
INR: 3.3
PROTHROMBIN TIME: 32.6 s — AB (ref 11.5–14.7)

## 2013-10-31 LAB — BASIC METABOLIC PANEL
ANION GAP: 6 — AB (ref 7–16)
BUN: 12 mg/dL (ref 7–18)
CHLORIDE: 102 mmol/L (ref 98–107)
CO2: 25 mmol/L (ref 21–32)
Calcium, Total: 8.7 mg/dL (ref 8.5–10.1)
Creatinine: 0.66 mg/dL (ref 0.60–1.30)
EGFR (Non-African Amer.): >60
GLUCOSE: 82 mg/dL (ref 65–99)
OSMOLALITY: 265 (ref 275–301)
Potassium: 4.7 mmol/L (ref 3.5–5.1)
Sodium: 133 mmol/L — ABNORMAL LOW (ref 136–145)

## 2013-10-31 LAB — CBC WITH DIFFERENTIAL/PLATELET
BASOS ABS: 0 10*3/uL (ref 0.0–0.1)
Basophil %: 0.8 %
EOS PCT: 2.4 %
Eosinophil #: 0.1 10*3/uL (ref 0.0–0.7)
HCT: 41 % (ref 35.0–47.0)
HGB: 13.4 g/dL (ref 12.0–16.0)
LYMPHS ABS: 1 10*3/uL (ref 1.0–3.6)
Lymphocyte %: 15.9 %
MCH: 32.2 pg (ref 26.0–34.0)
MCHC: 32.7 g/dL (ref 32.0–36.0)
MCV: 99 fL (ref 80–100)
MONO ABS: 0.6 x10 3/mm (ref 0.2–0.9)
Monocyte %: 10 %
NEUTROS ABS: 4.4 10*3/uL (ref 1.4–6.5)
Neutrophil %: 70.9 %
Platelet: 152 10*3/uL (ref 150–440)
RBC: 4.16 10*6/uL (ref 3.80–5.20)
RDW: 15.3 % — AB (ref 11.5–14.5)
WBC: 6.1 10*3/uL (ref 3.6–11.0)

## 2013-10-31 LAB — CULTURE, BLOOD (SINGLE)

## 2013-10-31 LAB — PROTIME-INR
INR: 3.4
Prothrombin Time: 33.1 secs — ABNORMAL HIGH (ref 11.5–14.7)

## 2013-11-01 LAB — PROTIME-INR
INR: 2.9
PROTHROMBIN TIME: 29.2 s — AB (ref 11.5–14.7)

## 2013-11-02 LAB — PROTIME-INR
INR: 2.3
Prothrombin Time: 25 secs — ABNORMAL HIGH (ref 11.5–14.7)

## 2013-11-02 LAB — CBC WITH DIFFERENTIAL/PLATELET
BASOS ABS: 0.1 10*3/uL (ref 0.0–0.1)
Basophil %: 0.7 %
EOS ABS: 0.3 10*3/uL (ref 0.0–0.7)
Eosinophil %: 3 %
HCT: 44.3 % (ref 35.0–47.0)
HGB: 14.4 g/dL (ref 12.0–16.0)
Lymphocyte #: 0.9 10*3/uL — ABNORMAL LOW (ref 1.0–3.6)
Lymphocyte %: 9.9 %
MCH: 32.4 pg (ref 26.0–34.0)
MCHC: 32.5 g/dL (ref 32.0–36.0)
MCV: 100 fL (ref 80–100)
Monocyte #: 0.7 x10 3/mm (ref 0.2–0.9)
Monocyte %: 7.5 %
NEUTROS PCT: 78.9 %
Neutrophil #: 6.9 10*3/uL — ABNORMAL HIGH (ref 1.4–6.5)
Platelet: 184 10*3/uL (ref 150–440)
RBC: 4.45 10*6/uL (ref 3.80–5.20)
RDW: 15.7 % — ABNORMAL HIGH (ref 11.5–14.5)
WBC: 8.8 10*3/uL (ref 3.6–11.0)

## 2013-11-02 LAB — BASIC METABOLIC PANEL
Anion Gap: 1 — ABNORMAL LOW (ref 7–16)
BUN: 9 mg/dL (ref 7–18)
CHLORIDE: 102 mmol/L (ref 98–107)
Calcium, Total: 8.9 mg/dL (ref 8.5–10.1)
Co2: 32 mmol/L (ref 21–32)
Creatinine: 0.94 mg/dL (ref 0.60–1.30)
EGFR (African American): >60
EGFR (Non-African Amer.): 55 — ABNORMAL LOW
GLUCOSE: 81 mg/dL (ref 65–99)
Osmolality: 268 (ref 275–301)
POTASSIUM: 4.2 mmol/L (ref 3.5–5.1)
Sodium: 135 mmol/L — ABNORMAL LOW (ref 136–145)

## 2013-11-03 LAB — PROTIME-INR
INR: 2.3
Prothrombin Time: 24.5 secs — ABNORMAL HIGH (ref 11.5–14.7)

## 2013-11-04 LAB — PROTIME-INR
INR: 2.7
PROTHROMBIN TIME: 28.3 s — AB (ref 11.5–14.7)

## 2013-11-05 LAB — BASIC METABOLIC PANEL
ANION GAP: 6 — AB (ref 7–16)
BUN: 32 mg/dL — AB (ref 7–18)
CALCIUM: 8.9 mg/dL (ref 8.5–10.1)
CHLORIDE: 108 mmol/L — AB (ref 98–107)
CREATININE: 1 mg/dL (ref 0.60–1.30)
Co2: 26 mmol/L (ref 21–32)
EGFR (African American): 59 — ABNORMAL LOW
EGFR (Non-African Amer.): 51 — ABNORMAL LOW
Glucose: 97 mg/dL (ref 65–99)
Osmolality: 286 (ref 275–301)
POTASSIUM: 3.5 mmol/L (ref 3.5–5.1)
SODIUM: 140 mmol/L (ref 136–145)

## 2013-11-05 LAB — PROTIME-INR
INR: 2.4
Prothrombin Time: 25.2 secs — ABNORMAL HIGH (ref 11.5–14.7)

## 2013-11-06 LAB — PROTIME-INR
INR: 2.4
Prothrombin Time: 25.8 secs — ABNORMAL HIGH (ref 11.5–14.7)

## 2013-11-06 LAB — HEMOGLOBIN: HGB: 12.5 g/dL (ref 12.0–16.0)

## 2013-11-11 ENCOUNTER — Ambulatory Visit
Admit: 2013-11-11 | Disposition: A | Discharge status: Home / Self Care | Payer: Self-pay | Attending: Nurse Practitioner | Admitting: Nurse Practitioner

## 2013-11-11 ENCOUNTER — Ambulatory Visit: Payer: Self-pay | Admitting: Internal Medicine

## 2013-11-17 ENCOUNTER — Inpatient Hospital Stay: Payer: Self-pay | Admitting: Internal Medicine

## 2013-11-17 LAB — COMPREHENSIVE METABOLIC PANEL
ALBUMIN: 3 g/dL — AB (ref 3.4–5.0)
ANION GAP: 5 — AB (ref 7–16)
AST: 26 U/L (ref 15–37)
Alkaline Phosphatase: 163 U/L — ABNORMAL HIGH
BUN: 26 mg/dL — ABNORMAL HIGH (ref 7–18)
Bilirubin,Total: 1.7 mg/dL — ABNORMAL HIGH (ref 0.2–1.0)
CALCIUM: 9.4 mg/dL (ref 8.5–10.1)
CHLORIDE: 107 mmol/L (ref 98–107)
CO2: 33 mmol/L — AB (ref 21–32)
Creatinine: 0.89 mg/dL (ref 0.60–1.30)
EGFR (Non-African Amer.): 59 — ABNORMAL LOW
GLUCOSE: 104 mg/dL — AB (ref 65–99)
OSMOLALITY: 294 (ref 275–301)
POTASSIUM: 4.2 mmol/L (ref 3.5–5.1)
SGPT (ALT): <6 U/L — ABNORMAL LOW (ref 12–78)
SODIUM: 145 mmol/L (ref 136–145)
Total Protein: 7.4 g/dL (ref 6.4–8.2)

## 2013-11-17 LAB — CBC WITH DIFFERENTIAL/PLATELET
BASOS ABS: 0.1 10*3/uL (ref 0.0–0.1)
Basophil %: 1.2 %
EOS PCT: 0.3 %
Eosinophil #: 0 10*3/uL (ref 0.0–0.7)
HCT: 45 % (ref 35.0–47.0)
HGB: 14.2 g/dL (ref 12.0–16.0)
Lymphocyte #: 1.2 10*3/uL (ref 1.0–3.6)
Lymphocyte %: 10.5 %
MCH: 32.3 pg (ref 26.0–34.0)
MCHC: 31.5 g/dL — ABNORMAL LOW (ref 32.0–36.0)
MCV: 102 fL — ABNORMAL HIGH (ref 80–100)
Monocyte #: 1 x10 3/mm — ABNORMAL HIGH (ref 0.2–0.9)
Monocyte %: 8.1 %
NEUTROS PCT: 79.9 %
Neutrophil #: 9.4 10*3/uL — ABNORMAL HIGH (ref 1.4–6.5)
Platelet: 193 10*3/uL (ref 150–440)
RBC: 4.39 10*6/uL (ref 3.80–5.20)
RDW: 17.5 % — ABNORMAL HIGH (ref 11.5–14.5)
WBC: 11.8 10*3/uL — ABNORMAL HIGH (ref 3.6–11.0)

## 2013-11-17 LAB — URINALYSIS, COMPLETE
Blood: NEGATIVE
Glucose,UR: NEGATIVE mg/dL (ref 0–75)
Hyaline Cast: 42
LEUKOCYTE ESTERASE: NEGATIVE
Nitrite: NEGATIVE
Ph: 5 (ref 4.5–8.0)
Protein: 100
Specific Gravity: 1.032 (ref 1.003–1.030)
Squamous Epithelial: 1
WBC UR: 10 /HPF (ref 0–5)

## 2013-11-17 LAB — TROPONIN I

## 2013-11-17 LAB — PROTIME-INR
INR: 1.4
Prothrombin Time: 17.2 secs — ABNORMAL HIGH (ref 11.5–14.7)

## 2013-11-17 LAB — LIPASE, BLOOD: Lipase: 72 U/L — ABNORMAL LOW (ref 73–393)

## 2013-11-17 LAB — TSH: Thyroid Stimulating Horm: 3.45 u[IU]/mL

## 2013-11-18 LAB — BASIC METABOLIC PANEL
ANION GAP: 2 — AB (ref 7–16)
BUN: 23 mg/dL — ABNORMAL HIGH (ref 7–18)
CHLORIDE: 107 mmol/L (ref 98–107)
CREATININE: 0.64 mg/dL (ref 0.60–1.30)
Calcium, Total: 8.5 mg/dL (ref 8.5–10.1)
Co2: 35 mmol/L — ABNORMAL HIGH (ref 21–32)
EGFR (African American): >60
EGFR (Non-African Amer.): >60
Glucose: 110 mg/dL — ABNORMAL HIGH (ref 65–99)
OSMOLALITY: 291 (ref 275–301)
POTASSIUM: 4.2 mmol/L (ref 3.5–5.1)
Sodium: 144 mmol/L (ref 136–145)

## 2013-11-18 LAB — CBC WITH DIFFERENTIAL/PLATELET
BASOS PCT: 0.6 %
Basophil #: 0.1 10*3/uL (ref 0.0–0.1)
EOS PCT: 0.3 %
Eosinophil #: 0 10*3/uL (ref 0.0–0.7)
HCT: 40.9 % (ref 35.0–47.0)
HGB: 12.8 g/dL (ref 12.0–16.0)
LYMPHS ABS: 0.6 10*3/uL — AB (ref 1.0–3.6)
LYMPHS PCT: 6.5 %
MCH: 32.5 pg (ref 26.0–34.0)
MCHC: 31.3 g/dL — AB (ref 32.0–36.0)
MCV: 104 fL — ABNORMAL HIGH (ref 80–100)
Monocyte #: 0.9 x10 3/mm (ref 0.2–0.9)
Monocyte %: 9.3 %
Neutrophil #: 8.2 10*3/uL — ABNORMAL HIGH (ref 1.4–6.5)
Neutrophil %: 83.3 %
PLATELETS: 149 10*3/uL — AB (ref 150–440)
RBC: 3.94 10*6/uL (ref 3.80–5.20)
RDW: 17.9 % — ABNORMAL HIGH (ref 11.5–14.5)
WBC: 9.8 10*3/uL (ref 3.6–11.0)

## 2013-11-18 LAB — PROTIME-INR
INR: 1.5
Prothrombin Time: 17.8 secs — ABNORMAL HIGH (ref 11.5–14.7)

## 2013-12-11 ENCOUNTER — Ambulatory Visit: Payer: Self-pay | Admitting: Internal Medicine

## 2013-12-11 DEATH — deceased

## 2014-10-04 NOTE — Discharge Summary (Signed)
PATIENT NAME:  [name], [name]:  [id number] DATE OF BIRTH:  1924/10/03  DATE OF ADMISSION:  10/26/2013 DATE OF DISCHARGE:  11/06/2013   DISCHARGE DIAGNOSES:  1. Acute L1 compression fracture status post fall.  2. Dementia.  3. Atrial fibrillation, on Coumadin.  4. Osteoporosis.  5. Hypertension.  6. Hyperlipidemia.  7. Hypothyroidism.  8. Parkinson's symptoms.   DISCHARGE MEDICATIONS:  1. Gabapentin 600 mg 1 tab p.o. at bedtime.  2. Brimonidine ophthalmic solution 0.15% 1 drop in the left eye 2 times a day.  3. Levothyroxine 50 mcg p.o. daily.  4. MiraLax 17 grams p.o. daily.  5. Atenolol 25 mg p.o. b.i.d.  6. Dorzolamide/timolol ophthalmic solution 1 drop to both eyes b.i.d.  7. Sinemet 25/100 one tab p.o. t.i.d.  8. Ropinirole 2 mg p.o. t.i.d.  9. Latanoprost ophthalmic 0.005% 1 drop in left eye once a day.  10. Acetaminophen 500 mg 2 capsules p.o. t.i.d.  11. Docusate 100 mg p.o. b.i.d.  12. Calcitonin 200 international units 1 spray in each nostril once a day, alternate each nostril. 13. Omeprazole 20 mg p.o. daily.  14. Cymbalta 60 mg p.o. daily.  15. Calcium 600 one tab p.o. b.i.d.  16. Warfarin 2.5 mg p.o. daily.  17. Tramadol 50 mg q.6 hours scheduled.   CONSULTATIONS: Orthopedics and palliative care.   PROCEDURES: None.   PERTINENT LABORATORY AND STUDIES: On day of discharge, INR was 2.4, creatinine 1, sodium 140, potassium 3.5. X-ray of the lumbar spine showed an acute L1 compression fracture.   BRIEF HOSPITAL COURSE:  1. Acute L1 compression fracture status post fall. The patient initially came in after a fall, found to have acute on chronic low back pain. X-ray consistent with an acute L1 compression fracture. She was seen by orthopedics, who recommended kyphoplasty, but family declined it. She has had difficulty controlling her pain during her hospital stay. I have suspicions that she has some abuse of the pain medication. Therefore, we have stopped all  narcotics. Will continue on scheduled Tylenol and scheduled tramadol at this time. I have asked the family to consider kyphoplasty as an outpatient. The family has discussed this with Dr. Ernest PineHooten. Dr. Rosita KeaMenz is out of the clinic right now, but potentially may do this as an outpatient depending on her pain status. 2. Hypertension. Her blood pressure remained low during her hospital stay. Therefore, her lisinopril, furosemide and Imdur were all held. She was asymptomatic during that process.  3. Atrial fibrillation, on Coumadin. The patient's Coumadin level was slightly elevated while she was on her antibiotic therapy for underlying pneumonia. The Coumadin dose has been decreased to 2.5 mg at this time. Will need to check another INR in about a week.  4. Other chronic issues are stable at this time.   DISPOSITION: She is in fair condition. Needs physical therapy and nursing care and significant motivation to do her therapy. I will follow up with her as an outpatient after she gets discharged from the SNF.   TIME SPENT: Please note that total time for discharge preparation was greater than 30 minutes.   ____________________________ Marisue IvanKanhka Jericha Bryden, MD kl:lb D: 11/06/2013 07:52:36 ET T: 11/06/2013 08:41:36 ET JOB#: [id number]  cc: Marisue IvanKanhka Vallerie Hentz, MD, <Dictator> Marisue IvanKANHKA Fabiano Ginley MD ELECTRONICALLY SIGNED 11/11/2013 8:27

## 2014-10-04 NOTE — H&P (Signed)
PATIENT NAME:  [name] Stanton, [name] Stanton MR#:  [id number] DATE OF BIRTH:  [birth year]  DATE OF ADMISSION:  08/27/2013  CHIEF COMPLAINT: Back pain.   HISTORY OF PRESENT ILLNESS: [name] Stanton is a pleasant 81 year old female with history of severe osteoporosis, multiple compression fractures in the past. She has had 2 to 3 days of excruciating back pain, very difficult to move, turn, etc. She had a CT of her pelvis and abdomen in the Emergency Room and found to have an L4 compression fracture that was felt to be new. She had multiple old compression fractures as well. Has multiple other medical problems and with the osteoporosis she has been residing at assisted living of late. She has a difficult time giving a history. Does have a younger relative with her that helps with the above. Could not give further history or review of systems, however.   PAST MEDICAL AND SURGICAL HISTORY: 1.  Osteoporosis, status post multiple fractures.  2.  Atrial fibrillation.  3.  Thyroid surgery.  4.  Cholecystectomy.  5.  Appendectomy and hernia repair.  6.  Knee surgery.  7.  Back surgery.  8.  Tubal ligation.  9.  Parkinson disease.   ALLERGIES: ASPIRIN.   FAMILY HISTORY: Father died in a tractor accident. Mother died at 65 of a stroke. Sister has hypertension and a brother with atrial fibrillation.   SOCIAL HISTORY: She is widowed, multiple adult children, and she is retired. No alcohol or tobacco now or in the past.   MEDICATIONS: As listed in the [facility] med reconciliation system include warfarin 3 mg daily except for Wednesday where she takes 2.5 mg, tramadol p.r.n., Tylenol p.r.n., Requip 2 mg t.i.d., MiraLAX p.r.n., Zofran as needed, lisinopril 20 mg daily, levothyroxine 50 mcg daily, latanoprost ophthalmic solution 1 drop left eye daily, Imdur 30 mg daily, gabapentin 600 mg at bedtime, Lasix 40 mg daily, entacapone 200 mg t.i.d., dorzolamide/timolol eye drops 1 to each eye b.i.d., docusate sodium 100 mg daily,  levodopa/carbidopa 25/100 t.i.d., calcium with vitamin D daily, brimonidine ophthalmic 1 drop b.i.d. to both eyes, atenolol 25 mg b.i.d.    ALLERGIES: No known drug allergies.   PHYSICAL EXAMINATION: GENERAL: Pleasant, moderate distress secondary to pain, lying on the gurney in the Emergency Room, elderly.  VITAL SIGNS:  Temperature 97.6, pulse 90, respirations 20, blood pressure 138/80, pulse ox 90%.  HEENT: OP clear. Conjunctivae normal.  NECK: No bruits, thyromegaly, adenopathy or JVD.  CHEST: Clear to auscultation and percussion on inspection.  HEART: Irregularly irregular.  No murmurs, rubs or gallops.    ABDOMEN: Soft, flat, nontender.  BACK: Tender to percussion.  EXTREMITIES: No clubbing, cyanosis or edema.  NEUROLOGIC: Nonfocal.   CT as noted for compression fracture. She also has a cystic lesion in the right ovary. Has narrowing of the origin of the SMA in abdomen as well. CBC is normal. Met-B is normal as well.   ASSESSMENT AND PLAN: 1.  Severe back pain, likely secondary to compression fracture: We will treat presumptively for now with Flector patch, p.r.n. morphine. Give her some senna.  Ensure she is on vitamin D. Discussed more aggressive osteoporosis therapy with her daughter, who says they have dealt with that in the past and have not found there is a viable medication for such. She also thinks that kyphoplasty is not feasible with her warfarin, and she has discussed this with Dr. Netty Starring in the past. We will, therefore, hold off on a consult. I will not start further parenteral  therapy for her osteoporosis at this point either.  2. Atrial fib: Stay on warfarin. Ensure we follow her pro times. We are changing meds; admittedly we will not change them drastically if possible. We will ensure daily pro times at this point.   She will likely need physical therapy consult and possible placement pending her response, though her pain level at this point will not allow the physical  therapist to begin work.     ____________________________ Ocie Cornfield. Ouida Sills, MD mwa:dmm D: 08/27/2013 19:01:49 ET T: 08/27/2013 19:20:45 ET JOB#: [id number]  cc: Ocie Cornfield. Ouida Sills, MD, <Dictator> Kirk Ruths MD ELECTRONICALLY SIGNED 08/28/2013 8:45

## 2014-10-04 NOTE — Discharge Summary (Signed)
PATIENT NAME:  [name] Stanton, [name] Stanton MR#:  [id number] DATE OF BIRTH:  07-Feb-1925  DATE OF ADMISSION:  08/27/2013 DATE OF DISCHARGE:  08/30/2013    DISCHARGE DIAGNOSES: 1.  L4 compression fracture.  2.  Atrial fibrillation.   DISCHARGE MEDICATIONS: 1.  Furosemide 40 mg p.o. daily.  2.  Gabapentin 600 mg p.o. at bedtime.  3.  Brimonidine ophthalmic 0.15%, 1 drop to left eye b.i.d.  4.  Warfarin 2.5 mg p.o. on Wednesday.  5.  Warfarin 3 mg p.o. daily, except for Wednesday.  6.  Imdur 30 mg p.o. daily.  7.  Levothyroxine 50 mcg p.o. daily.  8.  MiraLax 17 grams p.o. daily as needed.  9.  Atenolol 25 mg p.o. b.i.d.  10.  Docusate 100 mg p.o. b.i.d.  11.  Dorzolamide/timolol ophthalmic 1 drop each eye b.i.d.  12.  Carbidopa/levodopa 25/100, 1 tab p.o. t.i.d.  13.  Entacapone 200 mg 1 tab p.o. t.i.d. with meals.  14.  Ropinirole 2 mg p.o. t.i.d.  15.  Latanoprost ophthalmic 0.005%, 1 drop into left eye once a day.  16.  Lisinopril 20 mg p.o. b.i.d.  17.  Calcium/vitamin D 600/400, 1 tab p.o. b.i.d.  18.  Ondansetron 4 mg p.o. q.8 hours as needed for nausea, vomiting.  19.  Acetaminophen 500 mg tabs, 2 tabs p.o. q.8 hours scheduled.  20.  Oxycodone 5 mg p.o. q.4 hours as needed for pain.  21.  Diclofenac 1.3% topical film extended-release, apply topically every 12 hours for pain.   CONSULTS: None.   PROCEDURES: None.   PERTINENT LABORATORIES AND STUDIES: On day of discharge, her INR was 1.8.   BRIEF HOSPITAL COURSE:  1.  Acute uncontrolled back pain secondary to L4 compression fracture. The patient was confirmed to have an L4 compression fracture that was acute. She was not a surgical candidate, therefore, medical management at this time. She is to wear her corset. She is being treated with scheduled Tylenol 1 gram t.i.d., also being treated with diclofenac topical patch twice a day, has oxycodone 5 mg as needed for pain. She is not in a condition that she can care for herself and perform  her ADLs; therefore, she is going to a skilled nursing facility where she will receive nursing care and also physical therapy.  2.  Other chronic issues remain stable at this time. Continue with her home regimen.   DISPOSITION: She is in stable condition to be discharged to skilled nursing facility. Follow up with Dr. Burnadette PopLinthavong after discharge from skilled nursing facility.    ____________________________ Marisue IvanKanhka Jamani Eley, MD kl:jcm D: 08/30/2013 12:35:51 ET T: 08/30/2013 13:32:04 ET JOB#: [id number]  cc: Marisue IvanKanhka Heidi Lemay, MD, <Dictator> Marisue IvanKANHKA Ewald Beg MD ELECTRONICALLY SIGNED 09/16/2013 10:35

## 2014-10-04 NOTE — H&P (Signed)
PATIENT NAME:  [name] Stanton, [name] Stanton MR#:  [id number] DATE OF BIRTH:  [birth year]  DATE OF ADMISSION:  11/17/2013  PRIMARY CARE PROVIDER: Dr. Burnadette PopLinthavong.  EMERGENCY DEPARTMENT REFERRING PHYSICIAN: Dr. Dorothea GlassmanPaul Malinda.   CHIEF COMPLAINT: Decrease in responsiveness.   HISTORY OF PRESENT ILLNESS: The patient is an 81 year old white female who had a prolonged hospitalization recently after she had fallen. She was admitted for an L1 compression fracture and she was hospitalized from 05/16 to 05/27. The patient was discharged back to the skilled nursing facility. She is brought in because she has had decrease in p.o. intake, has been confused, and also was noted to have swelling on the left side of her neck. The patient was also having abdominal pain earlier therefore received some morphine. Currently, she is very lethargic and poorly responsive.   REVIEW OF SYSTEMS: Unobtainable. Her daughters are at bedside. They report that she has progressively gotten worse since this last fall and has not wanted to eat or drink much.   PAST MEDICAL HISTORY:  1. History of dementia.  2. History of all multiple compression fractures.  3. Osteoporosis.  4. Chronic atrial fibrillation on anticoagulation.  5. Hypertension.  6. Hyperlipidemia.  8. Hypothyroidism.  9. Questionable history of Parkinson's disease.  10. Status post appendectomy.  11. Status post cholecystectomy.   ALLERGIES: OXYCODONE.   CURRENT MEDICATIONS: She is on warfarin 2.5 mg daily, tramadol 50 q. 6 p.Stanton.n., Ropinirole 2 mg 3 times a day, MiraLAX 17 grams daily, omeprazole 20 daily, thyroxine 50 mcg daily, latanoprost 1 drop left eye once at bedtime, gabapentin 600 daily at bedtime, dorzolamide timolol 1 drop in both eyes 2 times a day, Colace 100 mg 1 tab p.o. q. 12, Cymbalta 60 daily, carbidopa levodopa 25 mg/100 at 1 tab p.o. t.i.d., calcium plus vitamin D 1 tab p.o. b.i.d., calcitonin 200 international units altered nostrils daily, brimonidine 1  drop to left eye b.i.d., atenolol 25 mg 1 tab p.o. b.i.d., Tylenol 650 q. 8 p.Stanton.n.  SOCIAL HISTORY: Does not smoke. Does not drink.   PHYSICAL EXAMINATION:  VITAL SIGNS: Temperature 99, pulse was 111, respiratory rate 18, blood pressure 101/60, O2 at 97% on 2 liters.  GENERAL: The patient is a critically ill-appearing female, poorly responsive.  HEENT: Head atraumatic, normocephalic. Pupils equally round, reactive to light and accommodation. There is no conjunctival pallor. No scleral icterus. Nasal exam shows no drainage or ulceration.  OROPHARYNX: Unable to examine due to the patient refusing to open her mouth.  NECK: The left side of her neck is swollen along the parotid gland. There is some warmth felt. There are no carotid bruits. No thyromegaly.  CARDIOVASCULAR: Irregularly irregular rhythm. No murmurs, rubs, clicks, or gallops.  LUNGS: Clear to auscultation bilaterally without any rales, rhonchi, wheezing.  ABDOMEN: Soft, nontender, nondistended. Positive bowel sounds x4.  EXTREMITIES: She has diminished DP, PT pulses. She also has some bluish discoloration of especially the right foot.  NEUROLOGICAL: The patient very lethargic and is not responding.  PSYCHIATRIC: Poorly responsive.  LYMPHATIC: Lymph nodes: Nonpalpable.  LABORATORIES: Evaluations so far: Glucose 104, BUN 26, creatinine 0.89, sodium 145, potassium 4.2, chloride 107, CO2 of 33, calcium 9.4, lipase 72. LFTs: Total protein 7.14, albumin 3.0, bilirubin total 1.7, alkaline phosphatase 163, AST 26. Troponin less than 0.02. TSH 3.45. WBC 11.8, hemoglobin 14.2, platelet count 193,000. INR of 1.4.  ASSESSMENT: The patient is an 81 year old white female with recent hospitalization from 05/16 to 05/27 after lumbar fracture and pneumonia sent to  skilled nursing facility brought back due to confusion, decrease in p.o. intake.  1. Acute encephalopathy due to parotitis as well as dehydration. At this time, we will treat her with IV  fluids 2. Parotitis, we will treat her with IV Zosyn.  3. Atrial fibrillation with rapid ventricular response. IV Cardizem drip, titrate heart rate to keep less than 100. 4. Hypothyroidism. We will continue supplements.  CODE STATUS: The patient is a DNR. Discussed with daughters. They may be leaning towards comfort care. Will ask palliative care team to see.   TIME SPENT ON THIS PATIENT: 60 minutes of critical care time.    ____________________________ [name] Scotts Allena Katz, MD shp:lt D: 11/17/2013 20:56:29 ET T: 11/17/2013 22:32:11 ET JOB#: [id number]  cc: [name] Stanton H. Allena Katz, MD, <Dictator> Charise Carwin MD ELECTRONICALLY SIGNED 11/26/2013 16:51

## 2014-10-04 NOTE — Consult Note (Signed)
Brief Consult Note: Diagnosis: L1 compression fracture.   Patient was seen by consultant.   Comments: appears to be in excruciating pain. Kyphoplasty recommended if she can be off Coumadin for procedure.  Electronic Signatures: Leitha SchullerMenz, Angee Gupton J (MD)  (Signed 17-May-15 09:05)  Authored: Brief Consult Note   Last Updated: 17-May-15 09:05 by Leitha SchullerMenz, Sherron Mummert J (MD)

## 2014-10-04 NOTE — Discharge Summary (Signed)
PATIENT NAME:  [name], [name] R MR#:  [id number] DATE OF BIRTH:  Dec 07, 1924  DATE OF ADMISSION:  11/17/2013 DATE OF DISCHARGE:  11/18/2013  FINAL DIAGNOSES: 1.  Left-sided parotitis.  2.  Metabolic encephalopathy.  3.  L1 compression fracture with severe pain.  4.  Atrial fibrillation with rapid ventricular rate.  5.  Hypertension.  6.  Hyperlipidemia.  7.  Hypothyroidism.   HISTORY AND PHYSICAL: Please see dictated admission history and physical.   SUMMARY OF HOSPITAL COURSE: The patient was admitted with generalized decline, poor oral intake, finding of left parotitis, as well as Afib with rapid rate. She was moved to the intensive care unit for treatment. Palliative care consultation was obtained, given the patient's multiple medical problems and her generalized deterioration. With the help of palliative care and  discussion with the family members, they recognized that the patient's overall quality of life deteriorated significantly and they wished to keep her comfortable. They wished to move the patient to hospice home, and she will be transferred to that facility with her physical activity to be up as tolerated with assistance. Her diet will be as tolerated. Her condition is fair.   DISCHARGE MEDICATIONS: 1.  Brimonidine ophthalmic solution 0.15% to the left eye b.i.d.  2.  Dorzolamide/timolol drops 1 drop to both eyes 2 times a day.  3.  Latanoprost 1 drop to the left eye once a day.  4.  Cymbalta 60 mg p.o. daily.  5.  Sinemet 25/100, 1 p.o. t.i.d.  6.  Morphine 20 mg/mL concentrate, 0.25 mL to 0.5 mL orally every 1 to 2 hours as needed for pain or dyspnea.  7.  Lorazepam 0.5 mg 1 to 2 tablets sublingually orally every 2 to 4 hours as needed for agitation or anxiety.   All other medications were held and this was discussed with the family members and they are comfortable with this plan.   The patient is DO NOT RESUSCITATE.    ____________________________ Lynnea FerrierBert J. Macenzie Burford III,  MD bjk:dmm D: 11/18/2013 12:38:00 ET T: 11/18/2013 12:51:40 ET JOB#: [id number]  cc: Lynnea FerrierBert J. Zo Loudon III, MD, <Dictator> Daniel NonesBERT Younes Degeorge MD ELECTRONICALLY SIGNED 11/19/2013 7:59

## 2014-10-04 NOTE — Consult Note (Signed)
PATIENT NAME:  [name] Stanton MR#:  [id number] DATE OF BIRTH:  [birth year]  DATE OF CONSULTATION:  10/27/2013  REFERRING PHYSICIAN:  Duane LopeJeffrey D. Judithann SheenSparks, MD CONSULTING PHYSICIAN:  [name] D. Juliann Paresallwood, MD  PRIMARY CARE PHYSICIAN:  [name] IvanKanhka Linthavong, MD  INDICATION: Pneumonia, atrial fibrillation and tachycardia.   HISTORY OF PRESENT ILLNESS: The patient is an 81 year old white female who [facility], followed by Dr. Burnadette PopLinthavong, who has a history of chronic atrial fibrillation on anticoagulation, had some recent back trauma with compression fracture who presented to the Emergency Room after a recent fall, found to have an L2 compression fracture and she was found to have some pneumonia on chest x-ray so was admitted for further evaluation and care. Complains of   and chronic palpitations and tachycardia.   PAST MEDICAL HISTORY:  Dementia, compression fractures, osteoporosis, atrial fibrillation, hypertension, hyperlipidemia, hypothyroidism, Parkinson's disease.   PAST SURGICAL HISTORY: Appendectomy, cholecystectomy.   MEDICATIONS:  Coumadin 2.5 a day, ropinirole 2 mg 3 times a day, oxycodone 5 mg every 4 hours p.Stanton.n., lisinopril 20 mg twice a day, Synthroid 50 mcg daily, Imdur 30 a day, gabapentin 600 at bedtime, Lasix 40 a day, Sinemet 1 tablet   times a day, atenolol 25 twice a day.   ALLERGIES:  None.    SOCIAL HISTORY: Nursing home, married, retired. No recent smoking or alcohol consumption.   FAMILY HISTORY: Noncontributory.   REVIEW OF SYSTEMS: Unobtainable because of confusion and dementia.   PHYSICAL EXAMINATION: VITAL SIGNS: Blood pressure 170/70, pulse of 100 and irregular, respiratory rate 14, afebrile.  HEENT: Normocephalic, atraumatic. Pupils equal and reactive to light.  NECK: Supple. No significant JVD, bruits or adenopathy.  LUNGS: Clear to auscultation and percussion. No significant wheezing, rhonchi or rale.  HEART: Irregularly irregular. Systolic ejection murmur  along the left sternal border. PMI nondisplaced.  ABDOMEN: Benign.  EXTREMITIES: Within normal limits.  NEUROLOGIC: Intact.  SKIN: Normal.   LABORATORY, DIAGNOSTIC AND RADIOLOGICAL DATA:  EKG chronic A. fib, rate of about 100 and nonspecific findings.  Chest x-ray possible   infiltrate  pneumonia.  X-rays of the spine showed compression fracture of L1.  CT of the head unremarkable.  PT was 28, INR 2.7. White count 8.6, hemoglobin 13.5. Glucose 139, BUN 26, creatinine 0.9, GFR 58. Troponin 0.07. CK 645, MB 3.8.   ASSESSMENT:  Chronic atrial fibrillation, L1 compression fracture, pneumonia, elevated cardiac enzymes, mild dehydration, dementia, hypothyroidism, benign hypertension.   PLAN:  1.  I agree with admit. Rule out myocardial infarction, followup cardiac enzymes and troponin. Continue anticoagulation for now. 2.  Chronic atrial fibrillation.  Continue Coumadin therapy. Will have to make a decision about whether her falls put her at increased risk while on Coumadin.  Continue atenolol for rate control but may increase the dose to help with her better rate, her rate is about 100, I would like to see it a little lower, maybe around 80 if at all possible.  3.  Pneumonia.  I agree with current therapy with antibiotics, inhalers as necessary and supplemental oxygen as necessary.  4.  Mild dehydration. I would continue  fluids. 5.  Dementia.  Continue current demential medications. 6.  For Parkinson's she is on  7.  For hypothyroidism. Continue thyroid medications as necessary.   8.  We will continue to treat the patient conservatively medically for her cardiac issues and see how the patient responds. I do not recommend invasive studies at this point.   ____________________________ Bobbie Stackwayne D.  Juliann Pares, MD ddc:cs D: 10/27/2013 11:32:00 ET T: 10/27/2013 14:23:09 ET JOB#: [id number]  cc: [name] D. Juliann Pares, MD, <Dictator> Alwyn Pea MD ELECTRONICALLY SIGNED 12/18/2013 16:40

## 2014-10-04 NOTE — Consult Note (Signed)
PATIENT NAME:  [name], Aspasia R MR#:  [id number] DATE OF BIRTH:  1924/08/01  DATE OF CONSULTATION:  10/27/2013  REFERRING PHYSICIAN:   CONSULTING PHYSICIAN:  Leitha SchullerMichael J. Onnie Alatorre, MD  REASON FOR CONSULTATION: Compression fracture.   HISTORY OF PRESENT ILLNESS: The patient is a very frail female who has been having significant back pain. She also suffers from significant confusion. She is 81 years old. She does not really give a history, however, she says she is not ambulatory. She is lying in bed in obvious severe pain. Her prior clinic records are noted and prior x-rays reviewed. She suffers from confusion, atrial fibrillation, is on chronic Coumadin.   PHYSICAL EXAMINATION:  She has a severe tenderness on exam in the lumbar region and shrieks in pain with palpation. Skin is intact. She does not have any clonus. Rest of sensorimotor exam is not reliable secondary to confusion.   Prior CT angiogram and recent x-rays were reviewed and they appear to show a new L1 compression fracture. She has old fractures at other levels of the spine including T11 and 12 that have been present for several years along with L4. She has been given a great deal of pain medication for this prior to admission.   CLINICAL IMPRESSION: L1 new compression fracture.    RECOMMENDATION: Is for kyphoplasty if family agrees.  I am holding Coumadin at present and will discuss this with the family and Dr. Burnadette PopLinthavong tomorrow. The family not available today.   ____________________________ Leitha SchullerMichael J. Loral Campi, MD mjm:cs D: 10/27/2013 19:47:46 ET T: 10/27/2013 20:14:56 ET JOB#: [id number]  cc: Leitha SchullerMichael J. Makinzie Considine, MD, <Dictator> Leitha SchullerMICHAEL J Justis Dupas MD ELECTRONICALLY SIGNED 10/28/2013 8:00

## 2014-10-04 NOTE — H&P (Signed)
PATIENT NAME:  [name] Stanton, [name] Stanton MR#:  [id number] DATE OF BIRTH:  25-Sep-1924  DATE OF ADMISSION:  10/26/2013  REFERRING PHYSICIAN: Caryn Bee A. Paduchowski, MD  FAMILY PHYSICIAN: Marisue Ivan, MD  REASON FOR ADMISSION: Pneumonia.   HISTORY OF PRESENT ILLNESS: The patient an 81 year old female who resides at the [facility], followed by Dr. Burnadette Pop. The patient has a significant history of chronic A. fib, on anticoagulation, and previous back trauma with compression fractures. Presents to the Emergency Room with back pain after a fall. In the Emergency Room, the patient was noted to have a new L1 compression fracture. She was also found have pneumonia on chest x-ray. She is now admitted for further evaluation.   PAST MEDICAL HISTORY: 1.  Dementia.  2.  Previous compression fractures.  3.  Osteoporosis.  4.  Chronic A. fib, on anticoagulation.  5.  Benign hypertension.  6.  Hyperlipidemia.  7.  Hypothyroidism.  8.  Questionable Parkinson's disease.  9.  Status post appendectomy.  10.  Status post cholecystectomy.   MEDICATIONS: 1.  Coumadin 2.5 mg p.o. q.p.m.  2.  Ropinirole 2 mg p.o. t.i.d.  3.  Oxycodone 5 mg p.o. q.4 hours p.r.n. pain.  4.  Lisinopril 20 mg p.o. b.i.d.  5.  Synthroid 50 mcg p.o. daily.  6.  Imdur 30 mg p.o. b.i.d.  7.  Gabapentin 600 mg p.o. at bedtime.  8.  Lasix 40 mg p.o. daily.  9.  Sinemet 1 p.o. t.i.d.  10.  Atenolol 25 mg p.o. b.i.d.   ALLERGIES: No known drug allergies.   SOCIAL HISTORY: Negative for alcohol or tobacco abuse.   FAMILY HISTORY: Noncontributory.   REVIEW OF SYSTEMS: Unable to obtain due to confusion.   PHYSICAL EXAMINATION: GENERAL: The patient is confused, elderly, in no acute distress.  VITAL SIGNS: Remarkable for a blood pressure of 180/85, heart rate 107, respiratory rate 17, temperature 97.9.  HEENT: Normocephalic, atraumatic. Pupils equally round and reactive to light and accommodation. Extraocular movements are intact. Sclerae  are anicteric. Conjunctivae are clear. Oropharynx is clear.  NECK: Supple without JVD. No adenopathy or thyromegaly is noted.  LUNGS: Reveal basilar rhonchi, left greater than right. No wheezes or rales. Respiratory effort is normal.  CARDIAC: Irregularly irregular rhythm. No significant rubs or gallops. PMI is nondisplaced. Chest wall is nontender.  ABDOMEN: Soft, nontender, with normoactive bowel sounds. No organomegaly or masses were appreciated. No hernias or bruits were noted.  EXTREMITIES: Without clubbing, cyanosis, or edema. Pulses were 2+ bilaterally.  SKIN: Warm and dry without rash or lesions.  NEUROLOGIC: Cranial nerves II through XII grossly intact. Deep tendon reflexes were symmetric. Motor and sensory exams nonfocal.  PSYCHIATRIC: Revealed a patient who was alert, but confused to place and time.   LABORATORY, DIAGNOSTIC, AND RADIOLOGICAL DATA: EKG revealed A. fib with no acute ischemic changes. Chest x-ray revealed a left basilar infiltrate consistent with pneumonia. LS-spine films revealed a new L1 compression fracture. Head CT was unremarkable. Her pro time was 28 with an INR of 2.7. White count 8.6 with a hemoglobin of 13.5. Glucose 139 with a BUN of 26, creatinine of 0.9, GFR of 58. Troponin was 0.07 with a total CK of 245 and an MB of 3.8.   ASSESSMENT: 1.  L1 compression fracture with back pain.  2.  Elevated cardiac enzymes.  3.  Left lower lobe pneumonia.  4.  Chronic atrial fibrillation, on anticoagulation.  5.  Mild dehydration.  6.  Dementia.  7.  Hypothyroidism.  8.  Benign hypertension.   PLAN: The patient will be admitted to the orthopedic floor with telemetry. Will continue Coumadin for now. Will begin IV antibiotics for the pneumonia and IV morphine as needed for back pain. Will consult physical therapy and ortho in regards to the back pain and compression fracture. Will consult cardiology because of her A. fib and elevated cardiac enzymes. Will follow serial  cardiac enzymes. Follow up routine labs and a chest x-ray in the morning. Continue her outpatient medications for now. Social work consult for placement. Further treatment and evaluation will depend upon the patient's progress.   TOTAL TIME SPENT ON THIS PATIENT: 50 minutes.    ____________________________ Duane LopeJeffrey D. Judithann SheenSparks, MD jds:jcm D: 10/26/2013 16:50:01 ET T: 10/26/2013 17:09:23 ET JOB#: [id number]  cc: Duane LopeJeffrey D. Judithann SheenSparks, MD, <Dictator> Marisue IvanKanhka Linthavong, MD Shray Hunley Rodena Medin Erven Ramson MD ELECTRONICALLY SIGNED 10/26/2013 17:34

## 2014-10-04 NOTE — Consult Note (Signed)
   Comments   Called by RN to see pt for uncontrolled pain. Fentanyl just increased which won't reach Cmax of higher dose for 18 hrs. Received 4mg  IV morphine earlier today for uncontrolled pain.  spoke with pt. She says that this is the "worse pain ever" including previous compression fx. Per H&P, pt is on oxycodone as outpt. Will restart as prn med for breakthrough pain.   Electronic Signatures: Dot Splinter, Harriett SineNancy (MD)  (Signed 19-May-15 15:48)  Authored: Palliative Care   Last Updated: 19-May-15 15:48 by Mariabelen Pressly, Harriett SineNancy (MD)

## 2015-04-11 IMAGING — CR DG LUMBAR SPINE 2-3V
1 series · 3 of 3 positions shown · non-contrast
Comparison: 08/27/2013

CLINICAL DATA: Back pain after fall

EXAM:
LUMBAR SPINE - 2-3 VIEW

[Series 1: t lumbar spine ap · 0.14mm/px · 3 of 3 slices shown]
[im 1/3]
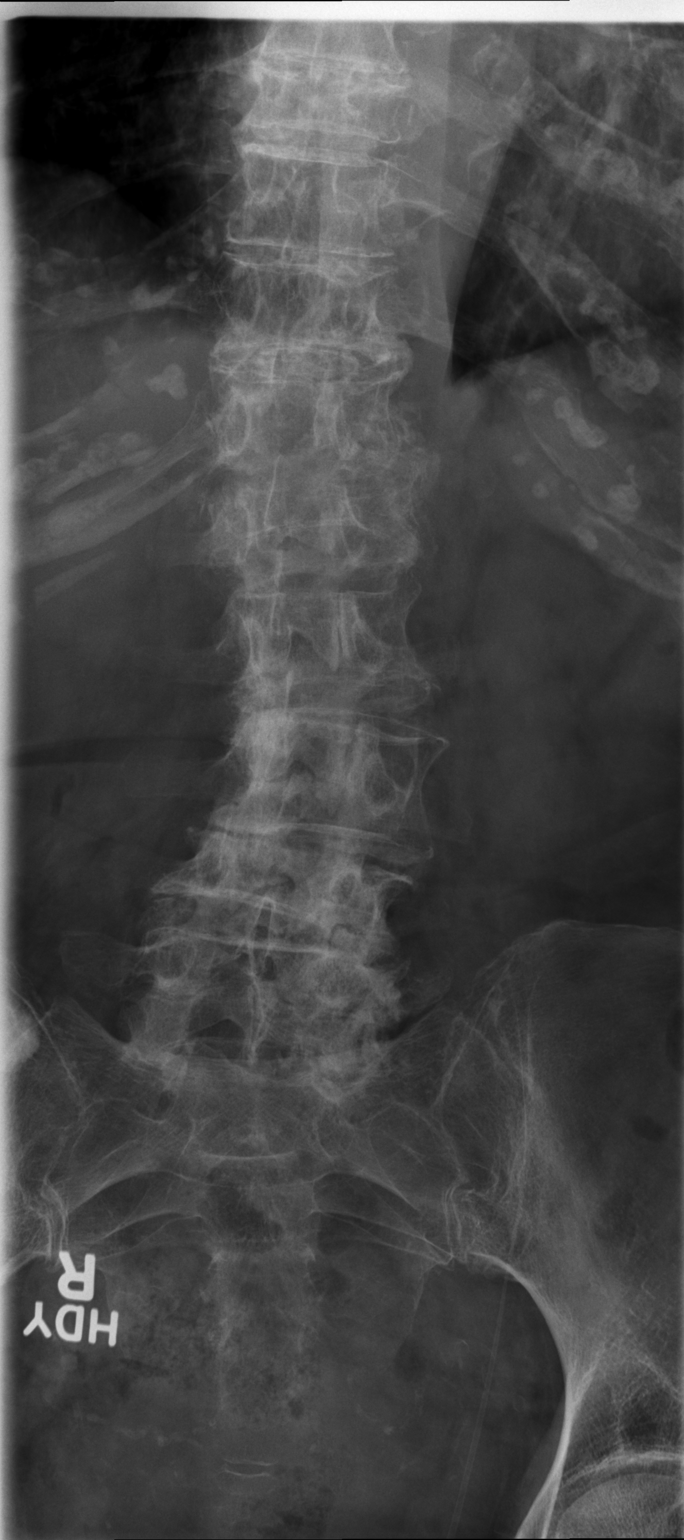
[im 2/3]
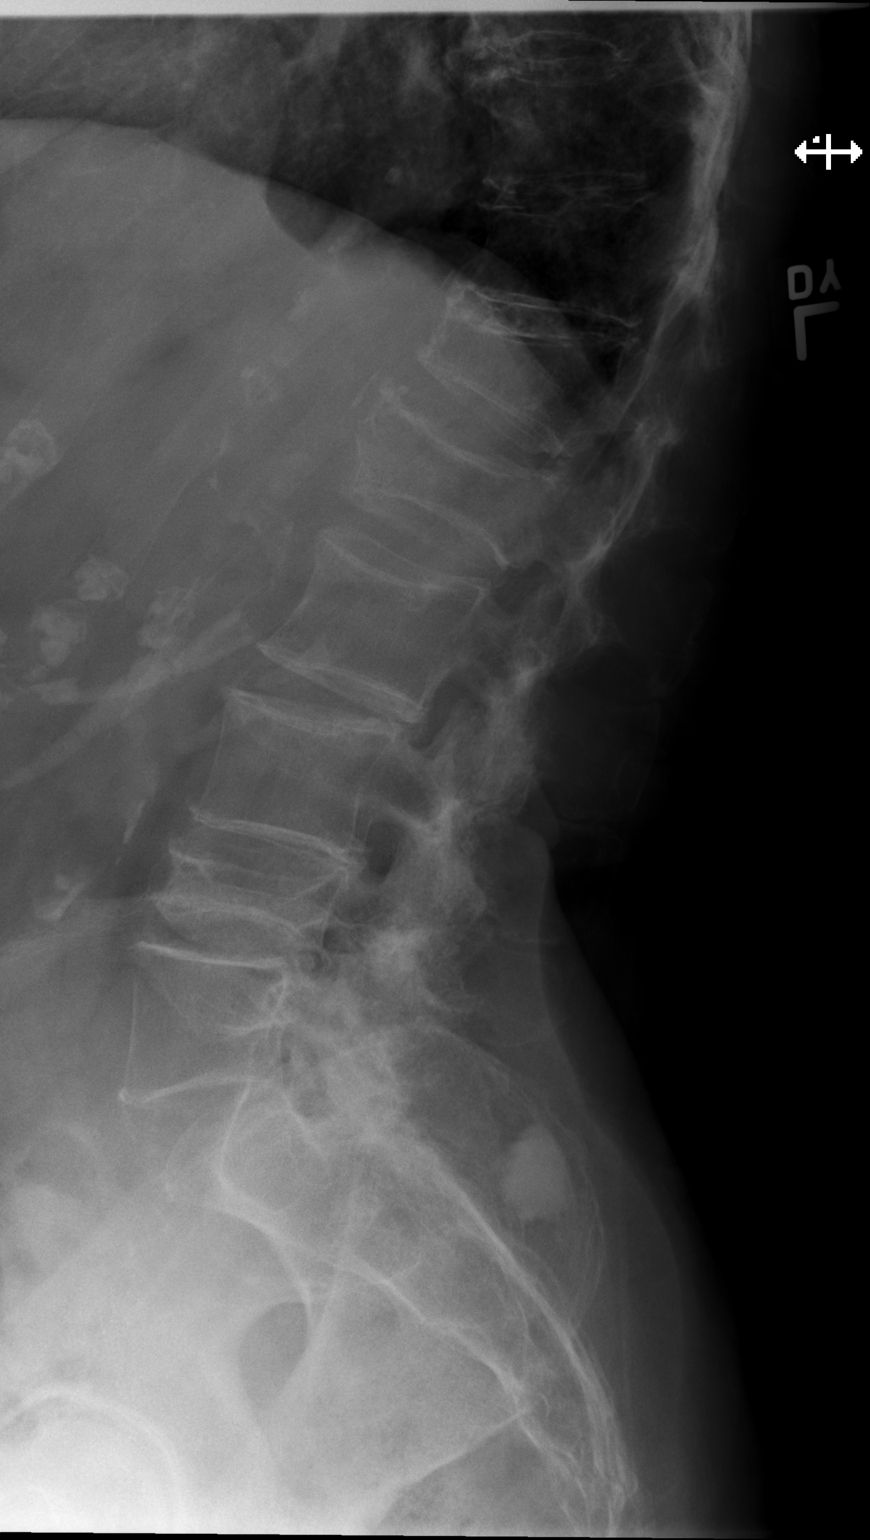
[im 3/3]
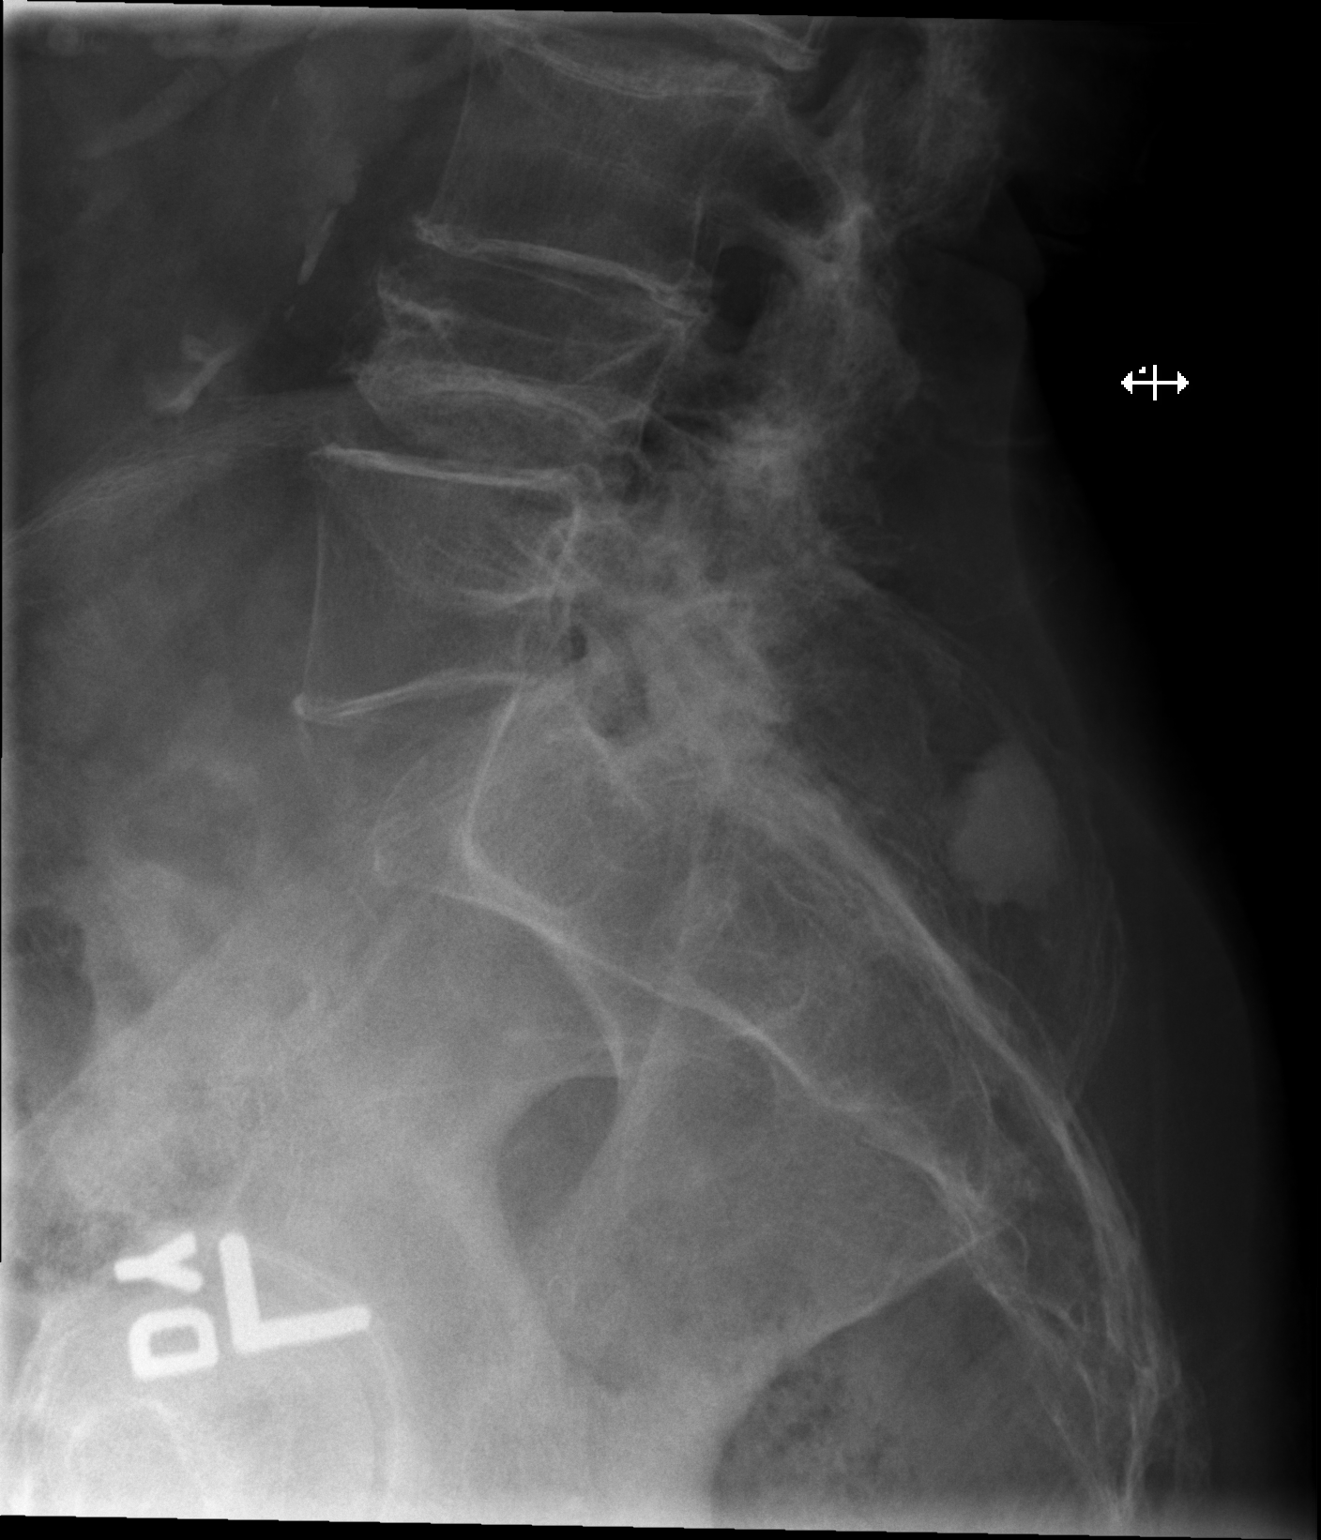

[3 of 3 positions shown; findings below may reference images not displayed]

FINDINGS: Scoliosis deformity involving the lumbar spine is convex towards the
left. There are stable compression fractures involving T12 and L4.
There is a new fracture involving the L1 vertebra. No retropulsion
of fracture fragments. Loss of approximately 40% of the vertebral
body height noted.
IMPRESSION: L1 compression fracture is new from 08/27/2013. No retropulsion of
fracture fragments identified.

Stable T12 and L4 compression fractures.

## 2015-04-11 IMAGING — CR DG THORACIC SPINE 2-3V
1 series · 2 of 2 positions shown · non-contrast
Comparison: DG LUMBAR SPINE 2-3V dated 10/26/2013;

CLINICAL DATA: Fall, back pain

EXAM:
THORACIC SPINE - 2 VIEW

[Series 1: t thoracic spine ap · 0.14mm/px · 2 of 2 slices shown]
[im 1/2]
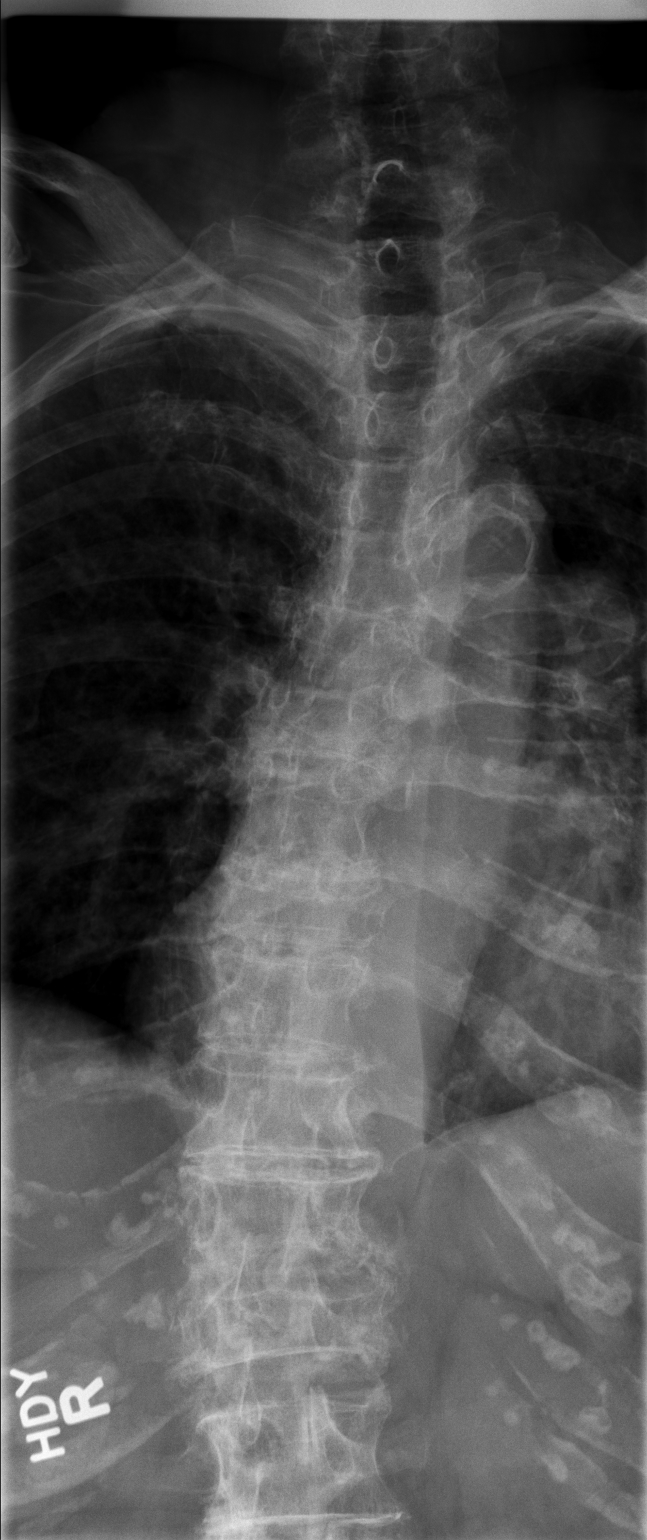
[im 2/2]
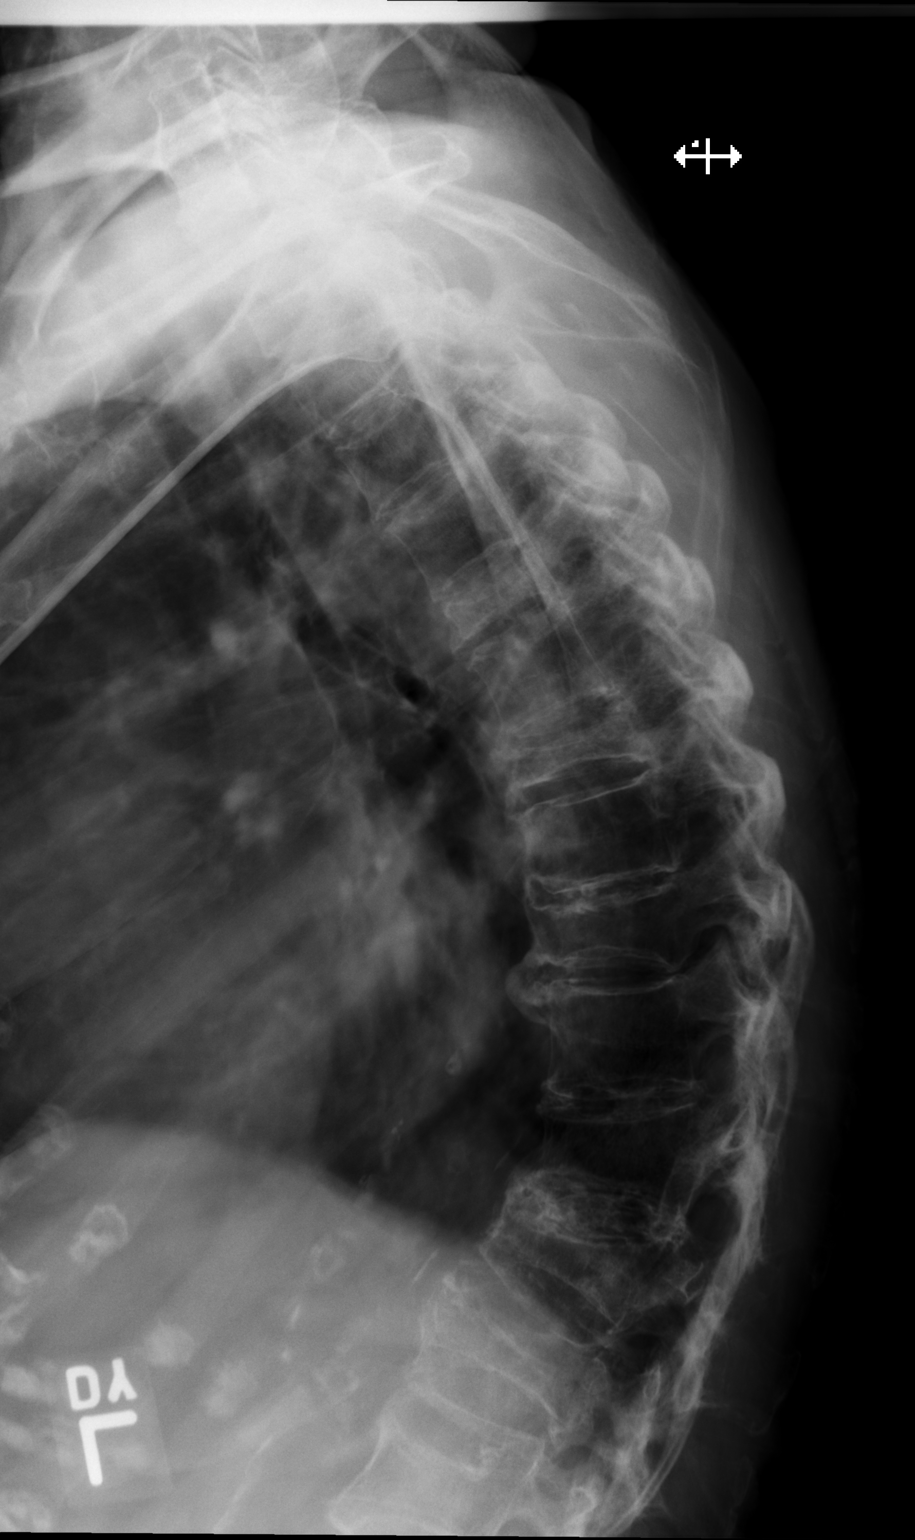

[2 of 2 positions shown; findings below may reference images not displayed]

DG RIBS BILAT 3V
dated 05/31/2013; DG LUMBAR SPINE 2-3V dated 12/18/2009; CT L SPINE
W/O CM dated 08/08/2009
FINDINGS: Rightward curvature of the thoracic spine is reidentified. The
costochondral cartilage overlies the left posterior ribs, simulating
fractures. Compression deformities at L1, T12, T11, T9, T7, and T5
are noted. The patient has not had dedicated prior [HOSPITAL] this
institution of the upper thoracic spine, but review of previous rib
radiographs confirms many of the mid and lower thoracic compression
deformities are present in 0133, but the T5 compression deformity
appears new since that time but is otherwise age indeterminate.
IMPRESSION: Multiple thoracolumbar compression deformities, some of which are
seen on prior imaging, although the thoracic spine is not completely
included in the field of view on all previous available exams. The
T5 compression fracture appears new since 0133 but demonstrates
interior sclerosis without bony retropulsion, suggesting chronicity.

## 2015-04-11 IMAGING — CR DG CHEST 1V PORT
1 series · 1 of 1 positions shown · non-contrast
Comparison: June 12, 2013

CLINICAL DATA: Status post fall today with pain

EXAM:
PORTABLE CHEST - 1 VIEW

[ap]
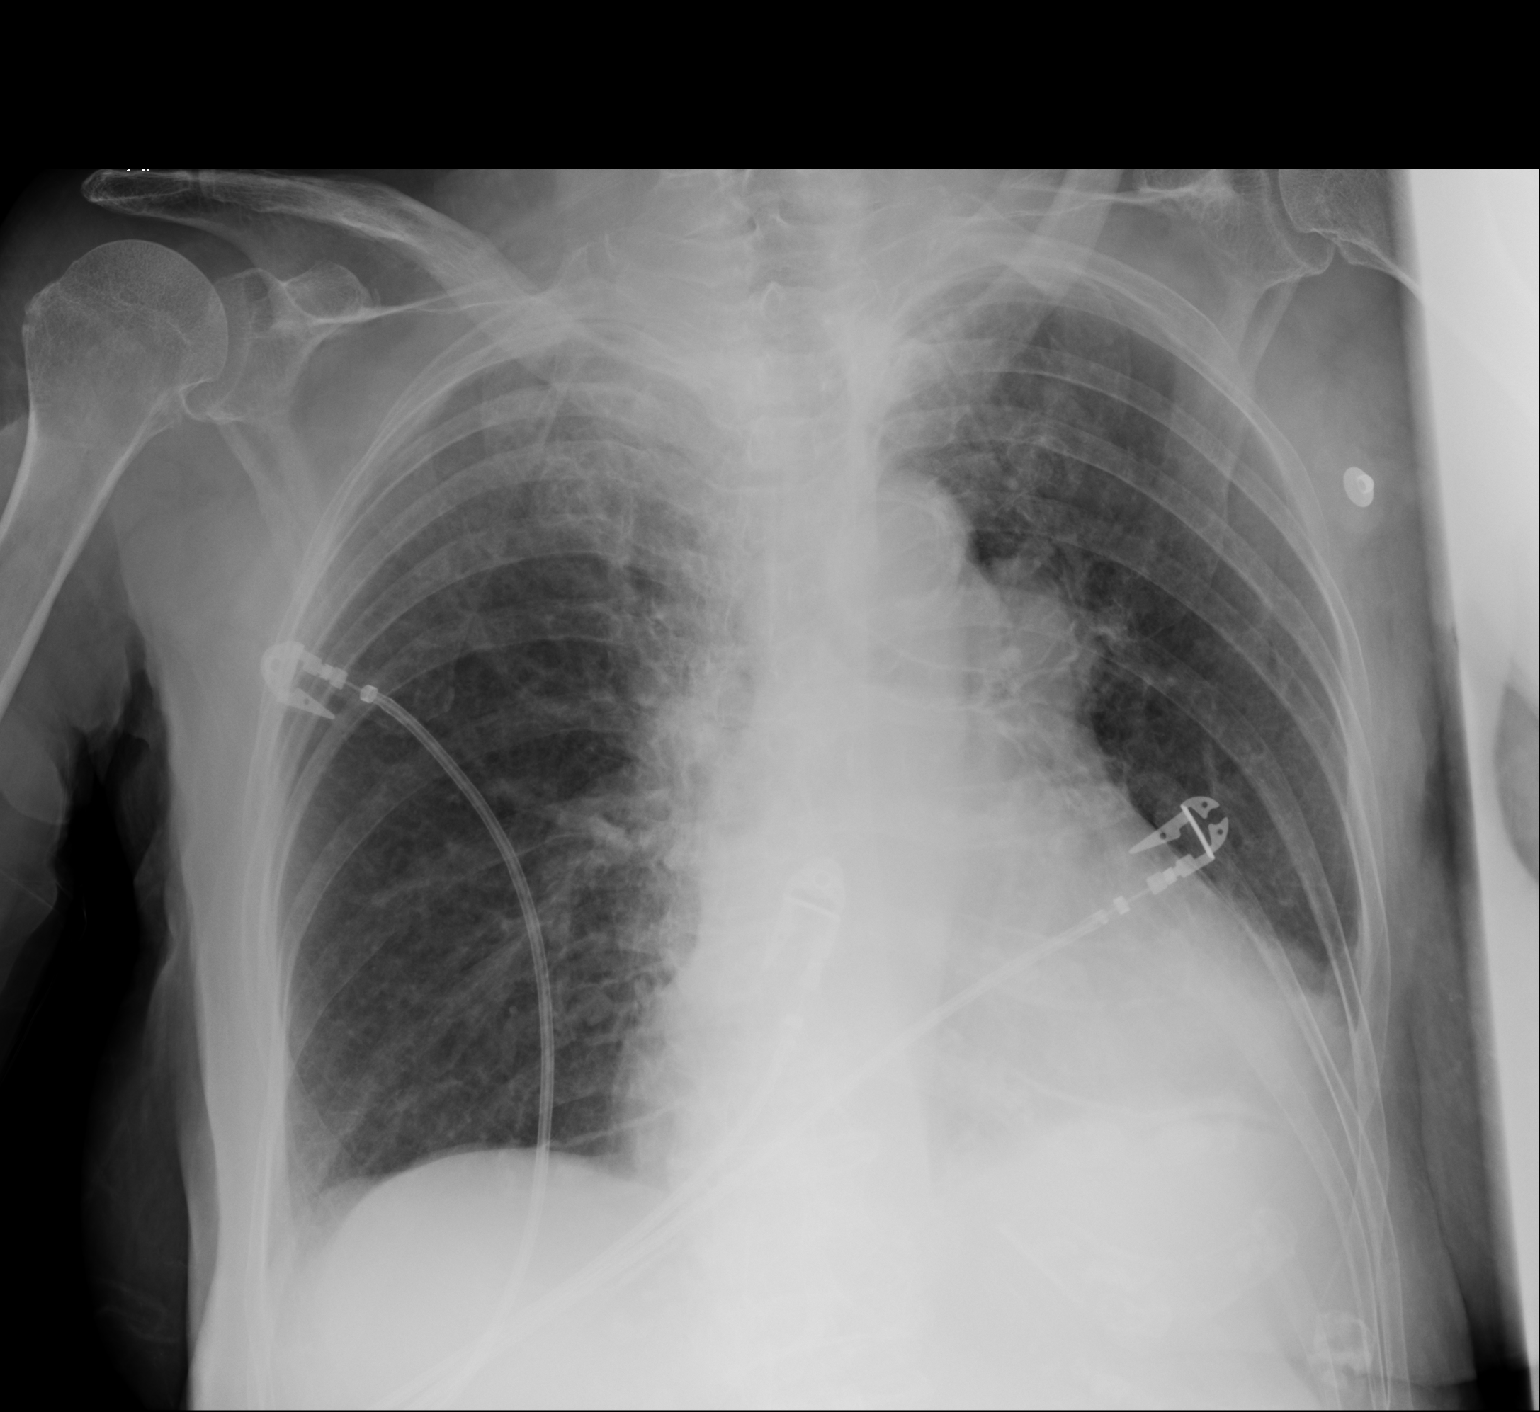

[1 of 1 positions shown; findings below may reference images not displayed]

FINDINGS: The heart size and mediastinal contours are stable. The heart size
is enlarged. There is no pulmonary edema or right pleural effusion.
There is patchy consolidation of left lung base. Is probable minimal
left pleural effusion. The visualized skeletal structures are
stable.
IMPRESSION: Patchy consolidation of left lung base, developing pneumonia is not
excluded. Cardiomegaly.

## 2015-05-03 IMAGING — CT CT MAXILLOFACIAL WITH CONTRAST
4 series · 16 of 47 positions shown, 18 images · IV contrast (agent unspecified)
Comparison: CT head 10/26/2013.

CLINICAL DATA: Elevated white count.  Parotid lesion.

EXAM:
CT HEAD WITHOUT CONTRAST
CT MAXILLOFACIAL WITH CONTRAST
TECHNIQUE: Multidetector CT imaging of the head and maxillofacial structures
were performed using the standard protocol without intravenous
contrast. Multiplanar CT image reconstructions of the maxillofacial
structures were also generated.

[Series 2: head wo · axial · 0.44mm/px · z∈[-141,-43]mm · 6 of 30 slices shown, 8 images]
[im 5/30  brain]
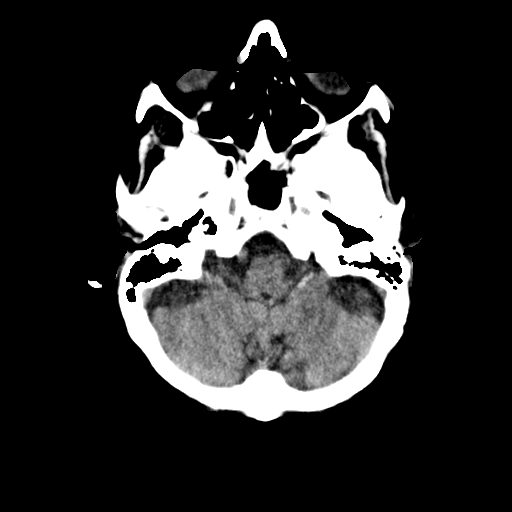
[im 5/30  bone]
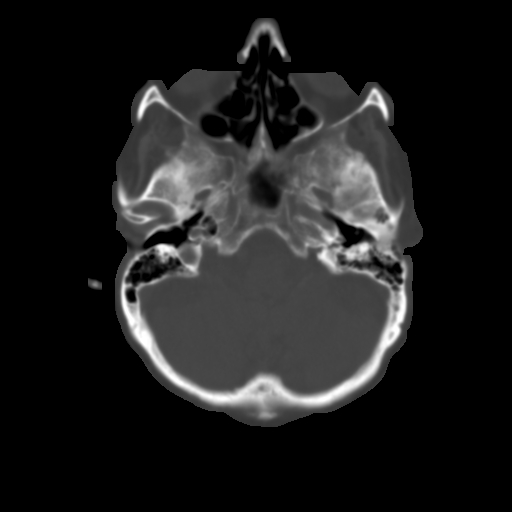
[im 9/30  bone]
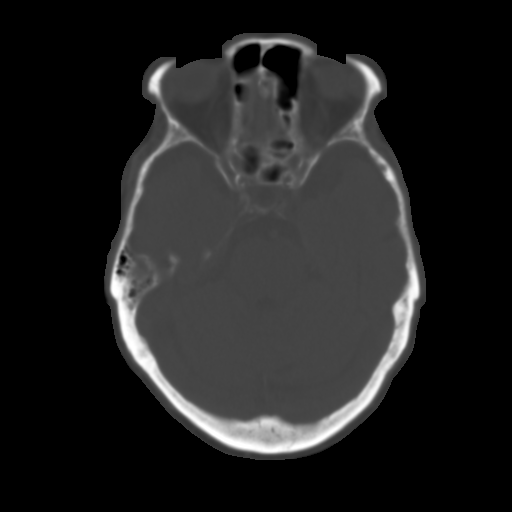
[im 13/30  bone]
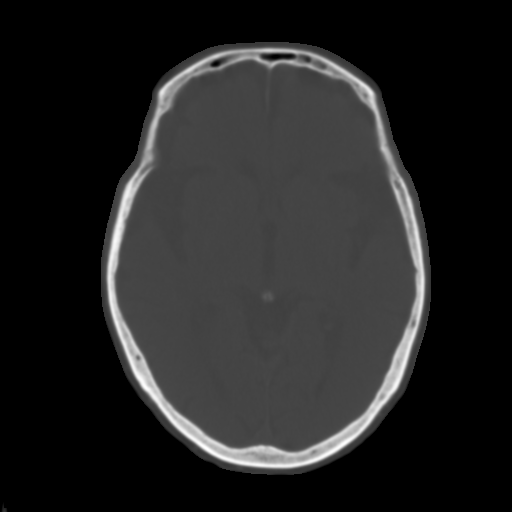
[im 17/30  bone]
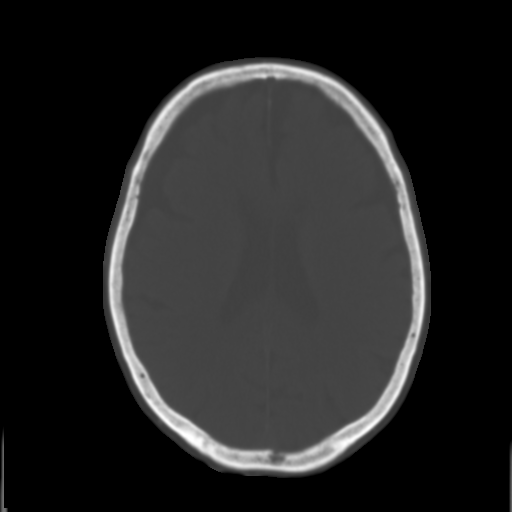
[im 21/30  brain]
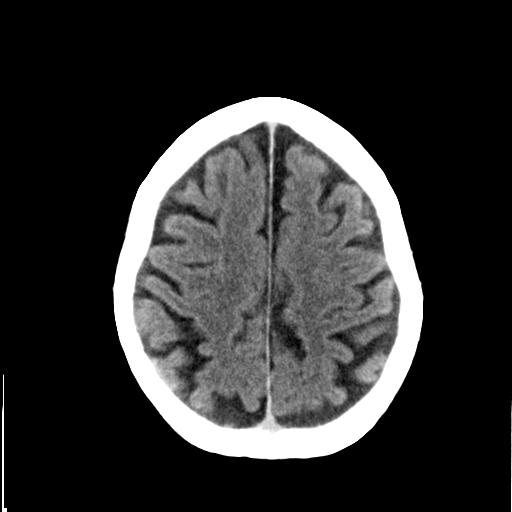
[im 21/30  bone]
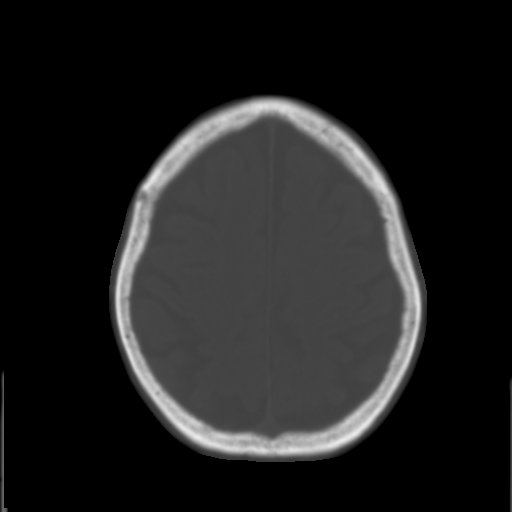
[im 25/30  bone]
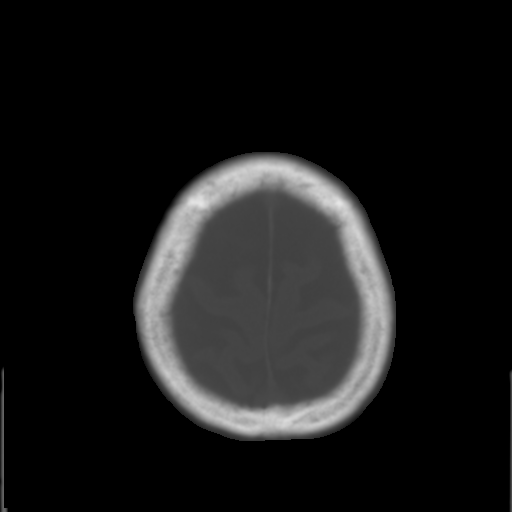

[Series 4: max soft · axial · 0.36mm/px · z∈[-259,-201]mm · 4 of 88 slices shown]
[im 9/88  brain]
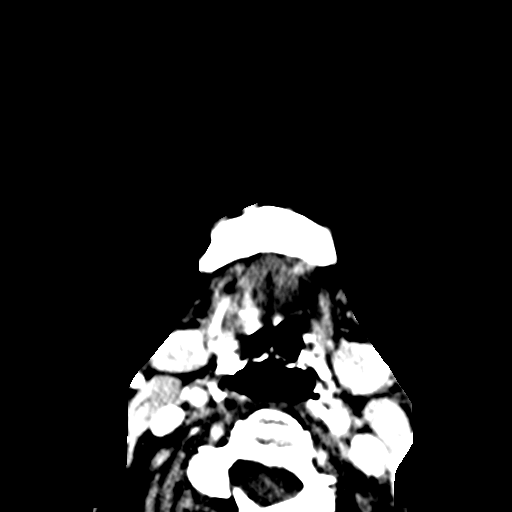
[im 17/88  brain]
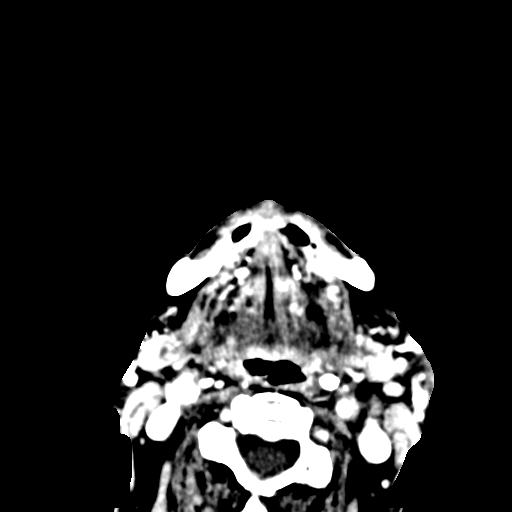
[im 30/88  brain]
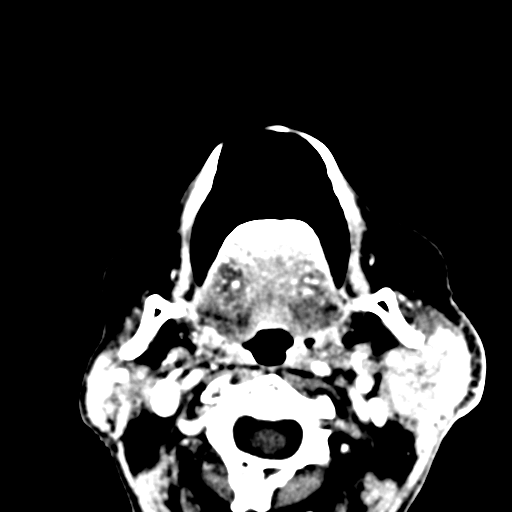
[im 38/88  brain]
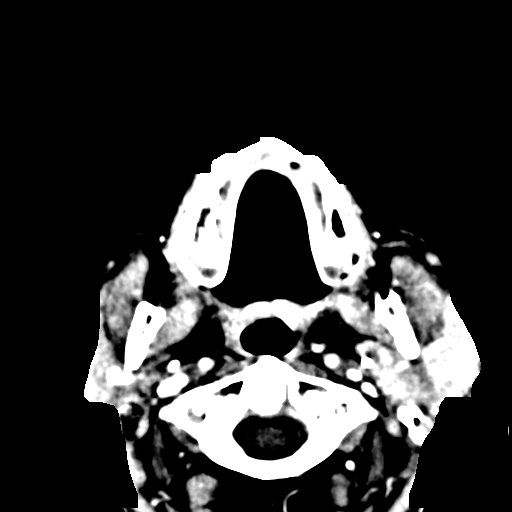

[Series 6: coronal soft · coronal · 0.36mm/px · 3 of 76 slices shown]
[im 26/76  bone]
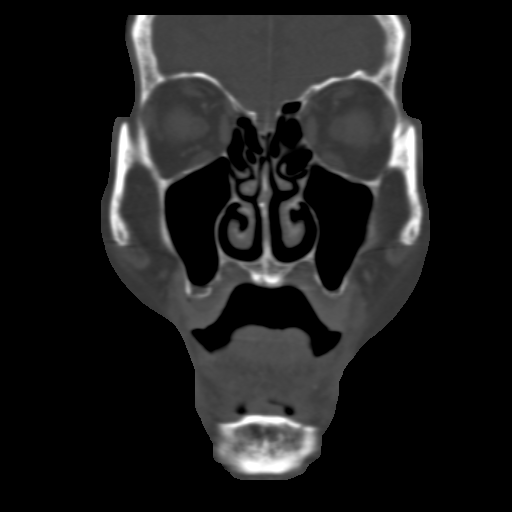
[im 34/76  bone]
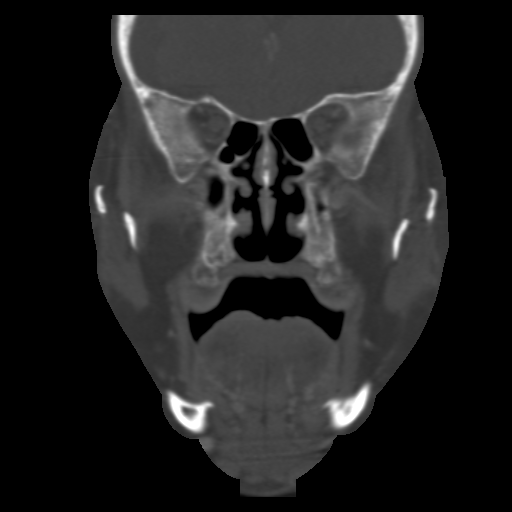
[im 42/76  bone]
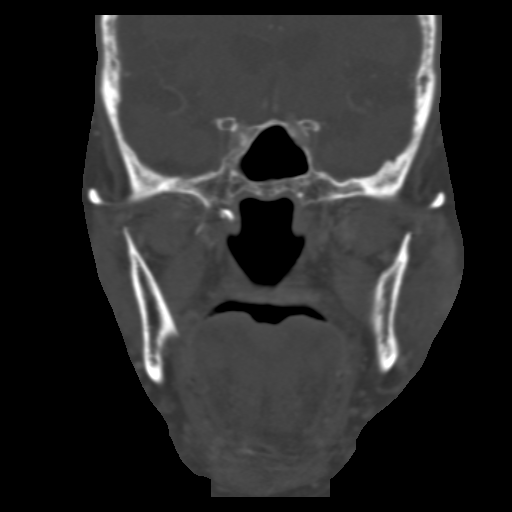

[Series 7: sagittal soft · sagittal · 0.36mm/px · 3 of 78 slices shown]
[im 26/78  bone]
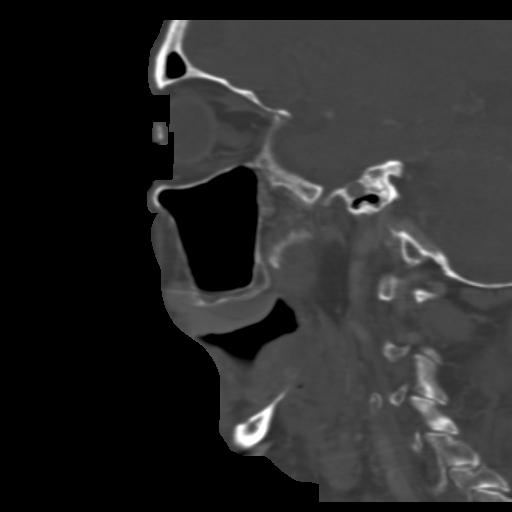
[im 39/78  bone]
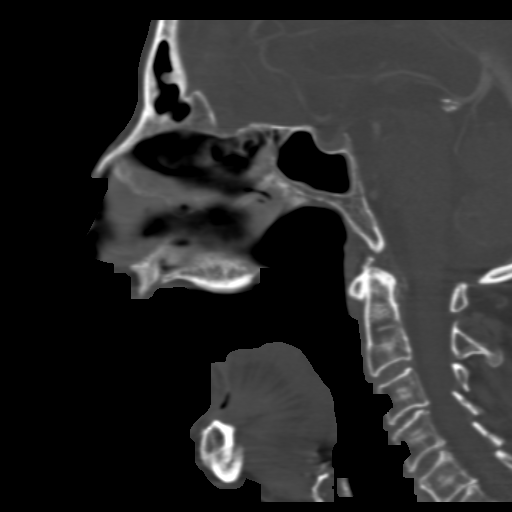
[im 52/78  bone]
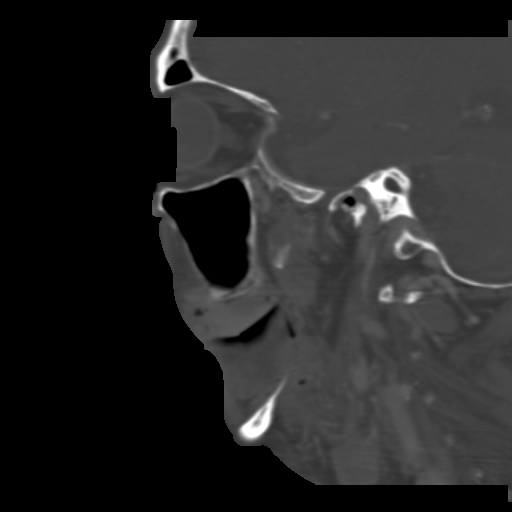

[16 of 47 positions shown; findings below may reference images not displayed]

FINDINGS: CT HEAD FINDINGS

No evidence for acute infarction, hemorrhage, mass lesion,
hydrocephalus, or extra-axial fluid. Generalized atrophy, not
unexpected for age 88. Right frontal subdural hygroma up to 7 mm
thick, noncompressive. Hypoattenuation white matter consistent with
chronic microvascular ischemic change. Calvarium intact. Bilateral
cataract extraction.

CT MAXILLOFACIAL FINDINGS

No facial fracture is evident. Minimal layering fluid in the
sphenoid appears nonaggressive. Slight layering fluid left mastoid,
likely effusion.

The superficial and deep lobes of the parotid gland are diffusely
enlarged, with hyperenhancement most notable superficial lobe more
prominently. Slight stranding of the subcutaneous fat. No focal
areas of hypoattenuation to suggest abscess. Incidental preauricular
calcification. No extension of the process into the left
parapharyngeal space. Regional lymph nodes are likely reactive,
level 2 station on the left, none exceeding short axis of 1 cm.
Carotid atherosclerosis. No destructive osseous lesions. TMJs
located. Cervical spondylosis. Edentulous.
IMPRESSION: Chronic changes as described.  No acute intracranial findings.

Interval development of left parotid enlargement and enhancement on
the left, likely unilateral parotitis. Correlate clinically for
viral versus bacterial etiology. Given the recent relatively normal
appearance of the parotid on head CT, as well as elevated white
count, diffuse neoplasm is less favored but not completely excluded.
Tissue sampling may be warranted.
